# Patient Record
Sex: Female | Born: 1985 | Hispanic: Yes | Marital: Married | Smoking: Never smoker
Health system: Southern US, Community
[De-identification: ages and names within clinical notes are randomized; demographics above are authoritative.]

## PROBLEM LIST (undated history)

## (undated) ENCOUNTER — Inpatient Hospital Stay (HOSPITAL_COMMUNITY): Payer: Self-pay

## (undated) DIAGNOSIS — R51 Headache: Secondary | ICD-10-CM

## (undated) DIAGNOSIS — K5900 Constipation, unspecified: Secondary | ICD-10-CM

## (undated) DIAGNOSIS — D352 Benign neoplasm of pituitary gland: Secondary | ICD-10-CM

## (undated) DIAGNOSIS — M1990 Unspecified osteoarthritis, unspecified site: Secondary | ICD-10-CM

## (undated) DIAGNOSIS — O24419 Gestational diabetes mellitus in pregnancy, unspecified control: Secondary | ICD-10-CM

## (undated) HISTORY — DX: Gestational diabetes mellitus in pregnancy, unspecified control: O24.419

## (undated) HISTORY — DX: Benign neoplasm of pituitary gland: D35.2

## (undated) HISTORY — DX: Constipation, unspecified: K59.00

---

## 2006-03-31 ENCOUNTER — Inpatient Hospital Stay (HOSPITAL_COMMUNITY): Admission: AD | Admit: 2006-03-31 | Discharge: 2006-03-31 | Payer: Self-pay | Admitting: *Deleted

## 2006-03-31 ENCOUNTER — Ambulatory Visit: Payer: Self-pay | Admitting: *Deleted

## 2006-04-17 ENCOUNTER — Inpatient Hospital Stay (HOSPITAL_COMMUNITY): Admission: AD | Admit: 2006-04-17 | Discharge: 2006-04-21 | Payer: Self-pay | Admitting: Obstetrics and Gynecology

## 2006-04-18 ENCOUNTER — Ambulatory Visit: Payer: Self-pay | Admitting: Obstetrics and Gynecology

## 2006-04-18 ENCOUNTER — Encounter (INDEPENDENT_AMBULATORY_CARE_PROVIDER_SITE_OTHER): Payer: Self-pay | Admitting: Specialist

## 2009-02-18 ENCOUNTER — Inpatient Hospital Stay (HOSPITAL_COMMUNITY): Admission: AD | Admit: 2009-02-18 | Discharge: 2009-02-18 | Payer: Self-pay | Admitting: Obstetrics & Gynecology

## 2009-03-30 ENCOUNTER — Inpatient Hospital Stay (HOSPITAL_COMMUNITY): Admission: AD | Admit: 2009-03-30 | Discharge: 2009-03-30 | Payer: Self-pay | Admitting: Family Medicine

## 2009-03-30 ENCOUNTER — Ambulatory Visit: Payer: Self-pay | Admitting: Obstetrics and Gynecology

## 2009-05-15 ENCOUNTER — Ambulatory Visit (HOSPITAL_COMMUNITY): Admission: RE | Admit: 2009-05-15 | Discharge: 2009-05-15 | Payer: Self-pay | Admitting: Obstetrics & Gynecology

## 2009-07-10 ENCOUNTER — Ambulatory Visit (HOSPITAL_COMMUNITY): Admission: RE | Admit: 2009-07-10 | Discharge: 2009-07-10 | Payer: Self-pay | Admitting: Obstetrics & Gynecology

## 2009-09-21 ENCOUNTER — Inpatient Hospital Stay (HOSPITAL_COMMUNITY): Admission: AD | Admit: 2009-09-21 | Discharge: 2009-09-23 | Payer: Self-pay | Admitting: Obstetrics & Gynecology

## 2009-09-21 ENCOUNTER — Ambulatory Visit: Payer: Self-pay | Admitting: Advanced Practice Midwife

## 2010-03-18 LAB — CBC
HCT: 35.6 % — ABNORMAL LOW (ref 36.0–46.0)
MCH: 33.4 pg (ref 26.0–34.0)
MCHC: 35 g/dL (ref 30.0–36.0)
MCV: 95.2 fL (ref 78.0–100.0)
Platelets: 127 10*3/uL — ABNORMAL LOW (ref 150–400)

## 2010-03-24 LAB — WET PREP, GENITAL
Clue Cells Wet Prep HPF POC: NONE SEEN
Trich, Wet Prep: NONE SEEN

## 2010-03-24 LAB — HCG, QUANTITATIVE, PREGNANCY: hCG, Beta Chain, Quant, S: 87473 m[IU]/mL — ABNORMAL HIGH (ref <5)

## 2010-03-24 LAB — CBC
HCT: 36.5 % (ref 36.0–46.0)
Hemoglobin: 12.4 g/dL (ref 12.0–15.0)
MCHC: 34 g/dL (ref 30.0–36.0)
RBC: 3.98 MIL/uL (ref 3.87–5.11)
RDW: 12.6 % (ref 11.5–15.5)

## 2010-03-24 LAB — URINALYSIS, ROUTINE W REFLEX MICROSCOPIC
Bilirubin Urine: NEGATIVE
Glucose, UA: NEGATIVE mg/dL
Ketones, ur: NEGATIVE mg/dL
Nitrite: NEGATIVE
Protein, ur: NEGATIVE mg/dL
Urobilinogen, UA: 0.2 mg/dL (ref 0.0–1.0)

## 2010-03-24 LAB — POCT PREGNANCY, URINE: Preg Test, Ur: POSITIVE

## 2010-03-24 LAB — URINE MICROSCOPIC-ADD ON

## 2010-03-24 LAB — GC/CHLAMYDIA PROBE AMP, GENITAL: Chlamydia, DNA Probe: NEGATIVE

## 2010-03-28 LAB — WET PREP, GENITAL: Yeast Wet Prep HPF POC: NONE SEEN

## 2010-03-28 LAB — GC/CHLAMYDIA PROBE AMP, GENITAL: Chlamydia, DNA Probe: NEGATIVE

## 2010-05-21 NOTE — Op Note (Signed)
NAME:  Amber Harper, Amber Harper     ACCOUNT NO.:  [id number]   MEDICAL RECORD NO.:  [id number]          PATIENT TYPE:  INP   LOCATION:  9174                          FACILITY:  [facility]   PHYSICIAN:  Phil D. Okey Harper, M.D.     DATE OF BIRTH:  20-Jul-1985   DATE OF PROCEDURE:  04/18/2006  DATE OF DISCHARGE:                               OPERATIVE REPORT   PROCEDURE:  Low transverse cesarean section.   PREOPERATIVE DIAGNOSIS:  Cephalopelvic disproportion.   POSTOPERATIVE DIAGNOSIS:  Cephalopelvic disproportion.   SURGEON:  Dr. Okey Harper.   ASSISTANT:  Certified midwife, Deirdre Poe.   ANESTHESIA:  Epidural.   ESTIMATED BLOOD LOSS:  600 mL.   POSTOPERATIVE CONDITION:  Satisfactory.   PROCEDURE WAS AS FOLLOWS:  Under satisfactory epidural anesthesia with  the patient in dorsal supine position and Foley catheter in the urinary  bladder, the abdomen was prepped and draped in the usual sterile manner  and entered through a Pfannenstiel suprapubic incision, accentuating 2  cm above the symphysis pubis and extending for a total length of 16 cm.  The abdomen was entered by layers.  On entering the peritoneal cavity,  the self-retaining __________  retractor was placed into the wound, and  the visceral peritoneum on the anterior surface of the uterus was opened  transversely by sharp dissection, the bladder pushed away from the lower  segment which was entered by sharp and blunt dissection, and from a LOT  presentation, the vertex and the baby was easily removed.  The cord was  doubly clamped and divided.  The baby handed to pediatrician.  Samples  of blood taken from the cord for analysis.  The placenta was  spontaneously removed and the uterus explored.  The uterus was then  closed with a continuous running locked 0 Vicryl on an atraumatic  needle.  The fascia was closed with a continuous running 0 Vicryl on an  atraumatic needle.  Subcutaneous bleeders were controlled with hot  cautery.  Skin  edges approximated with skin staples.  A dry sterile  dressing was applied.  The patient transferred to the recovery room in  satisfactory condition.           ______________________________  Amber Harper, M.D.     PDR/MEDQ  D:  04/18/2006  T:  04/18/2006  Job:  [id number]

## 2010-05-21 NOTE — Discharge Summary (Signed)
NAME:  Amber Harper, Amber Harper     ACCOUNT NO.:  [id number]   MEDICAL RECORD NO.:  [id number]          PATIENT TYPE:  INP   LOCATION:  9104                          [facility]ILITY:  [facility]   PHYSICIAN:  Phil D. Okey Dupre, M.D.     DATE OF BIRTH:  06-Feb-1985   DATE OF ADMISSION:  04/17/2006  DATE OF DISCHARGE:  04/21/2006                               DISCHARGE SUMMARY   Patient was admitted at 40 weeks and 1 day gestation with spontaneous  rupture of membranes and early labor.  Patient's labor was augmented  with Pitocin.  Patient continued to labor for approximately 20 hours  before the decision was made for a low transverse C-section.  Preoperative diagnosis was deep transverse arrest with reassuring fetal  heart tones.   On April 18, 2006, a low transverse C-section was performed by Dr. Okey Dupre  and Caren Griffins, C.N.M.  Please see operative note for details of this  surgery.  A female infant with Apgars of 8 and 9 was delivered and patient  proceeded to have admission to the post-partum unit, where she had three  days in-house observation and uneventful course of stay.   Patient was discharged on hospital day #3.  Vital signs remained stable,  and patient was afebrile throughout her hospital course.  Was discharged  home with her female infant.  She was given instructions to follow up at  the health department for post partum visit in six weeks in order for  the [facility] nurse to remove staples on postop day #5 during her home  visit.  Patient was sent home for prescription for Percocet 1-2 tablets  q.4-6h. p.r.n. pain, ibuprofen 600 mg p.o. q.6h. p.r.n. cramping, and  prenatal vitamins 1 p.o. daily while breast-feeding.  Patient was in  stable status at discharge.      Maylon Cos, C.N.M.      Phil D. Okey Dupre, M.D.  Electronically Signed    SS/MEDQ  D:  07/11/2006  T:  07/12/2006  Job:  [id number]

## 2012-07-04 ENCOUNTER — Ambulatory Visit: Payer: No Typology Code available for payment source | Attending: Family Medicine | Admitting: Family Medicine

## 2012-07-04 VITALS — BP 104/71 | HR 64 | Temp 98.0°F | Resp 16 | Ht <= 58 in | Wt 126.0 lb

## 2012-07-04 DIAGNOSIS — K649 Unspecified hemorrhoids: Secondary | ICD-10-CM | POA: Insufficient documentation

## 2012-07-04 DIAGNOSIS — R635 Abnormal weight gain: Secondary | ICD-10-CM

## 2012-07-04 DIAGNOSIS — J309 Allergic rhinitis, unspecified: Secondary | ICD-10-CM

## 2012-07-04 DIAGNOSIS — K625 Hemorrhage of anus and rectum: Secondary | ICD-10-CM

## 2012-07-04 DIAGNOSIS — K5900 Constipation, unspecified: Secondary | ICD-10-CM | POA: Insufficient documentation

## 2012-07-04 HISTORY — DX: Constipation, unspecified: K59.00

## 2012-07-04 LAB — CBC
HCT: 38.4 % (ref 36.0–46.0)
MCV: 84.4 fL (ref 78.0–100.0)
RDW: 13.7 % (ref 11.5–15.5)
WBC: 6.9 10*3/uL (ref 4.0–10.5)

## 2012-07-04 LAB — TSH: TSH: 1.235 u[IU]/mL (ref 0.350–4.500)

## 2012-07-04 LAB — LIPID PANEL: Total CHOL/HDL Ratio: 3.8 Ratio

## 2012-07-04 LAB — POCT URINE PREGNANCY: Preg Test, Ur: NEGATIVE

## 2012-07-04 MED ORDER — LORATADINE 10 MG PO TABS: 10.0000 mg | ORAL_TABLET | Freq: Every day | ORAL | Status: DC

## 2012-07-04 MED ORDER — DOCUSATE SODIUM 100 MG PO CAPS: 100.0000 mg | ORAL_CAPSULE | Freq: Two times a day (BID) | ORAL | Status: DC

## 2012-07-04 MED ORDER — FLUTICASONE PROPIONATE 50 MCG/ACT NA SUSP: 2.0000 | Freq: Every day | NASAL | Status: DC

## 2012-07-04 NOTE — Progress Notes (Signed)
Patient ID: Amber Harper, female   DOB: 10-30-1985, 27 y.o.   MRN: [id number] Spanish Translator used to speak with patient   CC: establish  HPI: Pt reports that she has been having constipation.  Pt says that she is having some bleeding from hemorrhoids. Pt says that she is taking vitamins but not taking any birth control pills.  Pt was concerned about weight gain.  Pt says she is using condoms with her husband for birth control.   No Known Allergies History reviewed. No pertinent past medical history. No current outpatient prescriptions on file prior to visit.   No current facility-administered medications on file prior to visit.   History reviewed. No pertinent family history. History   Social History  . Marital Status: Single    Spouse Name: N/A    Number of Children: N/A  . Years of Education: N/A   Occupational History  . Not on file.   Social History Main Topics  . Smoking status: Not on file  . Smokeless tobacco: Not on file  . Alcohol Use: Not on file  . Drug Use: Not on file  . Sexually Active: Not on file   Other Topics Concern  . Not on file   Social History Narrative  . No narrative on file    Review of Systems  Constitutional: Negative for fever, chills, diaphoresis, activity change, appetite change and fatigue.  HENT: Negative for ear pain, nosebleeds, congestion, facial swelling, rhinorrhea, neck pain, neck stiffness and ear discharge.   Eyes: Negative for pain, discharge, redness, itching and visual disturbance.  Respiratory: Negative for cough, choking, chest tightness, shortness of breath, wheezing and stridor.   Cardiovascular: Negative for chest pain, palpitations and leg swelling.  Gastrointestinal: Negative for abdominal distention.  Genitourinary: Negative for dysuria, urgency, frequency, hematuria, flank pain, decreased urine volume, difficulty urinating and dyspareunia.  Musculoskeletal: Negative for back pain, joint swelling,  arthralgias and gait problem.  Neurological: Negative for dizziness, tremors, seizures, syncope, facial asymmetry, speech difficulty, weakness, light-headedness, numbness and headaches.  Hematological: Negative for adenopathy. Does not bruise/bleed easily.  Psychiatric/Behavioral: Negative for hallucinations, behavioral problems, confusion, dysphoric mood, decreased concentration and agitation.    Objective:   Filed Vitals:   07/04/12 1308  BP: 104/71  Pulse: 64  Temp: 98 F (36.7 C)  Resp: 16    Physical Exam  Constitutional: Appears well-developed and well-nourished. No distress.  HENT: Normocephalic. External right and left ear normal. Oropharynx is clear and moist. large lesion on tongue Eyes: Conjunctivae and EOM are normal. PERRLA, no scleral icterus.  Neck: Normal ROM. Neck supple. No JVD. No tracheal deviation. No thyromegaly.  CVS: RRR, S1/S2 +, no murmurs, no gallops, no carotid bruit.  Pulmonary: Effort and breath sounds normal, no stridor, rhonchi, wheezes, rales.  Abdominal: Soft. BS +,  no distension, tenderness, rebound or guarding.  Musculoskeletal: Normal range of motion. No edema and no tenderness.  Lymphadenopathy: No lymphadenopathy noted, cervical, inguinal. Neuro: Alert. Normal reflexes, muscle tone coordination. No cranial nerve deficit. Skin: Skin is warm and dry. No rash noted. Not diaphoretic. No erythema. No pallor.  Psychiatric: Normal mood and affect. Behavior, judgment, thought content normal.   Lab Results  Component Value Date   WBC 12.1* 09/21/2009   HGB 12.5 09/21/2009   HCT 35.6* 09/21/2009   MCV 95.2 09/21/2009   PLT 127* 09/21/2009   No results found for this basename: CREATININE, BUN, NA, K, CL, CO2    No results found for this  basename: HGBA1C   Lipid Panel  No results found for this basename: chol, trig, hdl, cholhdl, vldl, ldlcalc       Assessment and plan:   Patient Active Problem List   Diagnosis Date Noted  . Unspecified  constipation 07/04/2012   Allergic Rhinitis Chronic Constipation Large lesion on left side of tongue Amenorrhea  Check pregnancy test today - negative   Start medications for constipation - colace 100 mg po BID   Follow lab results  RTC in 3 months  The patient was given clear instructions to go to ER or return to medical center if symptoms don't improve, worsen or new problems develop.  The patient verbalized understanding.  The patient was told to call to get any lab results if not heard anything in the next week.    Rodney Langton, MD, CDE, FAAFP [facility] [place], [place]

## 2012-07-04 NOTE — Progress Notes (Signed)
Patient here to establish care Needs physical 

## 2012-07-04 NOTE — Patient Instructions (Signed)
Constipacin - Adulto  (Constipation, Adult)  Constipacin significa que una persona tiene menos de 3 evacuaciones en una semana, hay dificultad para evacuar el intestino, o las heces son secas, duras, o ms grandes que lo normal. A medida que envejecemos el estreimiento es ms comn. Si intenta curar el estreimiento con medicamentos que producen la evacuacin intestinal (laxantes), el problema puede empeorar. El uso prolongado de laxantes puede hacer que los msculos del colon se debiliten. Una dieta baja en fibra, no tomar suficientes lquidos y el uso de ciertos medicamentos pueden Agricultural engineer.  CAUSAS   Ciertos medicamentos, como los antidepresivos, analgsicos, suplementos de hierro, anticidos y diurticos.   Algunas enfermedades, como la diabetes, el sndrome del colon irritable (SII), enfermedad de la tiroides, o depresin.   No beber suficiente agua.   No consumir suficientes alimentos ricos en fibra.   Situaciones de estrs o viajes.  Falta de actividad fsica o de ejercicio.  No ir al bao cuando siente la necesidad.  Ignorar la necesidad sbita de Film/video editor intestino.  Uso en exceso de laxantes. SNTOMAS   Evacuar el intestino menos de 3 veces a la semana.   Dificultad para mover el [place] y duras, o ms grandes que las normales.   Sensacin de estar lleno o distendido.   Dolor en la parte baja del abdomen  No se siente alivio despus de evacuar el intestino. DIAGNSTICO  El mdico le har una historia clnica y le har un examen fsico. Pueden hacerle exmenes adicionales para el estreimiento grave. Algunas pruebas son:   Un radiografa con enema de bario para examinar el recto, el colon y en algunos casos el intestino delgado.  Una sigmoidoscopia para examinar el colon inferior.  Una colonoscopia para examinar todo el colon. TRATAMIENTO  El tratamiento depender de la gravedad de la constipacin y de la  causa. Algunos tratamientos dietticos son beber ms lquidos y comer ms alimentos ricos en fibra. El cambio en el estilo de vida incluye hacer ejercicios de [place] regular. Si estas recomendaciones para Animator dieta y en el estilo de vida no ayudan, el mdico le puede indicar el uso de laxantes de venta libre para Industrial/product designer movimiento intestinal. Los medicamentos con Statistician se pueden prescribir si los medicamentos de venta libre no lo mejoran.  INSTRUCCIONES PARA EL CUIDADO EN EL HOGAR   Aumente el consumo de alimentos con [place], como frutas, verduras, granos enteros y frijoles. Limite los azcares ricos en grasas y procesados   en su dieta, tales como papas fritas, hamburguesas, galletas, dulces y refrescos.   Puede agregar un suplemento de fibra a su dieta si no obtiene lo suficiente de los alimentos.   Debe ingerir gran cantidad de lquido para mantener la orina de tono claro o color amarillo plido.   Haga ejercicios regularmente o segn las indicaciones de su mdico.   Vaya al bao cuando sienta la necesidad de ir. No espere.  Tome slo la medicacin que le indic el profesional.  No tome otros medicamentos para la constipacin sin Teacher, adult education a su mdico. SOLICITE ATENCIN MDICA DE INMEDIATO SI:   Observa sangre brillante en las heces.   La constipacin dura ms de 4 das o empeora.   Siente dolor abdominal o rectal.   Las heces son delgadas como un lpiz.  Pierde peso de [place] inexplicable. ASEGRESE DE QUE:   Comprende estas instrucciones.  Controlar su enfermedad.  Solicitar ayuda de inmediato si  no mejora o empeora. Document Released: 01/09/2007 Document Revised: 06/21/2011 [facility] Patient Information 2014 [place], [place]. Ejercicios para perder peso (Exercise to Lose Weight) La actividad fsica y Neomia Dear dieta saludable ayudan a perder peso. El mdico podr sugerirle ejercicios especficos. IDEAS Y CONSEJOS PARA HACER EJERCICIOS  Elija  opciones econmicas que disfrute hacer , como caminar, andar en bicicleta o los vdeos para ejercitarse.  Utilice las Microbiologist del ascensor.  Camine durante la hora del almuerzo.  Estacione el auto lejos del lugar de [place] o [place].  Concurra a un gimnasio o tome clases de gimnasia.  Comience con 5  10 minutos de actividad fsica por da. Ejercite hasta 30 minutos, 4 a 6 das por [address]  Utilice zapatos que tengan un buen soporte y ropas cmodas.  Elongue antes y despus de Company secretary.  Ejercite hasta que aumente la respiracin y el corazn palpite rpido.  Beba agua extra cuando ejercite.  No haga ejercicio Firefighter, sentirse mareado o que le falte mucho el aire. La actividad fsica puede quemar alrededor de 150 caloras.  Correr 20 cuadras en 15 minutos.  Jugar vley durante 45 a 60 minutos.  Limpiar y encerar el auto durante 45 a 60 minutos.  Jugar ftbol americano de toque.  Caminar 25 cuadras en 35 minutos.  Empujar un cochecito 20 cuadras en 30 minutos.  Jugar baloncesto durante 30 minutos.  Rastrillar hojas secas durante 30 minutos.  Andar en bicicleta 80 cuadras en 30 minutos.  Caminar 30 cuadras en 30 minutos.  Bailar durante 30 minutos.  Quitar la nieve con una pala durante 15 minutos.  Nadar vigorosamente durante 20 minutos.  Subir escaleras durante 15 minutos.  Andar en bicicleta 60 cuadras durante 15 minutos.  Arreglar el jardn entre 30 y 45 minutos.  Saltar a la soga durante 15 minutos.  Limpiar vidrios o pisos durante 45 a 60 minutos. Document Released: 03/26/2010 Document Revised: 03/14/2011 [facility] Patient Information 2014 [place], [place]. Dieta basada en el recuento de caloras (Calorie Counting Diet) Una dieta basada en el recuento de caloras requiere que consuma la cantidad de caloras que usted necesita durante Medical laboratory scientific officer. Las caloras son la medida de la energa que se obtienen de los alimentos que consumimos.  Ingerir la cantidad [place] de caloras es importante para Pharmacologist un peso saludable. Si ingiere demasiadas caloras, el organismo las [employer] y aumentar de [place]. Si ingiere pocas caloras, perder peso. El recuento de las caloras que se ingieren durante Designer, fashion/clothing si recibe la cantidad Geologist, engineering. Un nutricionista matriculado podr determinar cuntas caloras necesita por da. La cantidad de caloras necesarias vara de Neomia Dear persona a otra. Si su objetivo es perder peso deber consumir menos caloras. Si tiene sobrepeso o tiene problemas de [employer] cardacas, hipertensin arterial o diabetes, la prdida de algunos kilos lo beneficiar. Si su objetivo es aumentar de peso deber consumir ms caloras. Si tiene problemas de salud que requieran que usted tenga ms [place], puede ser necesario que aumente de [place]. CONSEJOS Ya sea que aumente o disminuya el nmero de caloras que consume [employer], puede ser difcil acostumbrarse a Multimedia programmer los hbitos de lo que come o bebe. Los siguientes consejos lo ayudarn a Midwife un control del nmero de caloras que consume.  La medicin de los [employer] hogar, con una jarra medidora lo ayudar a saber la cantidad real de alimentos y caloras que consume.  En los restaurantes se sirven alimentos en diferentes porciones  y tamaos. Es frecuente que en los restaurantes se sirvan los alimentos en cantidades que puedan dividirse en 2 porciones o ms. Al salir a Psychologist, forensic, puede ser til estimar cuntas porciones le sirven. Por ejemplo, una porcin a arroz cocido es de  taza y tiene el tamao de la mitad de un puo. Conocer el tamao de las porciones le ayudar a tener una mejor idea de la cantidad de comida que come en un restaurante.  Pida porciones pequeas o solicite las porciones para nios.  Planifique comer la mitad de una porcin en el restaurante y lleve el resto a su hogar o comprtala con un  amigo.  Lea las etiquetas para conocer el contenido de caloras y el tamao de las porciones. La mayora de los alimentos envasados tiene la informacin nutricional en algn lugar del envase. Aqu podr encontrar cuntas porciones contiene el envase, el tamao de la porcin y el nmero de caloras de [place].  Por ejemplo, digamos que un envase contiene tres galletitas. En la Informacin Nutricional se [place] que una porcin corresponde a Financial controller. [place], dice que hay tres porciones en el envase. En la seccin de las caloras se informa que contiene 90 caloras. Esto significa que cada galletita tiene 90 caloras. Si come una galletita, habr consumido 90 caloras. Si come tres galletitas, habr consumido tres veces esa cantidad, o sea 270 caloras. La lista que sigue le mostrar el tamao de algunas porciones comunes.   1 onza.................4 dados apilados  3 onzas..............Marland KitchenUn mazo de cartas  1 cucharadita...Marland KitchenMarland KitchenLa punta de un dedo pequeo  1 cucharada.......Marland KitchenUn dedo  2 cucharadas....Marland KitchenMarland KitchenUna pelota de golf   taza..................la mitad de un puo  1 taza..................Marland KitchenUn puo LLEVE UN REGISTRO DE LO QUE COME Escriba todo lo que come, las cantidades y Medical illustrator de Advertising copywriter en cada comida que haga Administrator. Al final o durante el da puede ir sumando la cantidad de caloras que ha ingerido. Puede ser de utilidad crear Neomia Dear lista como la que se [place] a continuacin. Conozca la informacin sobre caloras [employer]. Desayuno  Salvado (1 taza, 110 caloras).  Leche descremada ( taza, 45 caloras). Colacin  Manzana (1 mediana, 80 caloras). Almuerzo  [place] (1 taza, 20 caloras).  Tomate ( mediana, 20 caloras).  Pollo, pechuga (3 onzas, 165 caloras).  Queso shredded cheddar ( taza, 110 calories).  Aderezo italiano diettico (2 cucharadas, 60 caloras).  Pan de trigo integral (1 rebanada, 80 caloras).  Margarina (1 cucharada, 35  caloras).  Sopa de vegetales (1 taza, 160 caloras). Cena  [place] de cerdo (3 onzas, 190 caloras).  Arroz marrn (1 taza, 215 caloras).  Brcoli hervido ( cup, 20 calories).  Jinny Sanders (1  taza, 65 caloras).  Crema batida (1 cucharada, 50 caloras). Total de caloras diarias: 1425 Document Released: 04/07/2008 Document Revised: 03/14/2011 [facility] Patient Information 2014 [place], [place].

## 2012-07-05 LAB — COMPLETE METABOLIC PANEL WITH GFR
AST: 18 U/L (ref 0–37)
Alkaline Phosphatase: 74 U/L (ref 39–117)
Creat: 0.69 mg/dL (ref 0.50–1.10)
GFR, Est African American: >89 mL/min
Sodium: 140 mEq/L (ref 135–145)

## 2012-07-05 NOTE — Progress Notes (Signed)
Quick Note:  Please provide language translator as needed: Please inform patient that her labs came back okay.   Rodney Langton, MD, CDE, FAAFP [facility] [place], [place]   ______

## 2012-07-10 ENCOUNTER — Ambulatory Visit: Payer: No Typology Code available for payment source | Attending: Family Medicine | Admitting: Family Medicine

## 2012-07-10 VITALS — BP 93/71 | HR 60 | Temp 98.7°F | Resp 16 | Ht <= 58 in | Wt 127.0 lb

## 2012-07-10 DIAGNOSIS — R220 Localized swelling, mass and lump, head: Secondary | ICD-10-CM

## 2012-07-10 DIAGNOSIS — I861 Scrotal varices: Secondary | ICD-10-CM | POA: Insufficient documentation

## 2012-07-10 NOTE — Patient Instructions (Signed)
Tongue Laceration   A tongue laceration is a cut on the tongue. Over the next 1 to 2 days, you will see that the wound edges appear gray in color. The edges may appear ragged and slightly spread apart. Because of all the normal bacteria in the mouth, these wounds are contaminated, but this is not an infection that needs antibiotics. Most wounds heal with no problems despite their appearance.  TREATMENT   Most tongue lacerations only go partway through the tongue. This type of injury generally does not need stitches (sutures). More serious injuries penetrate the tongue deeper and may need sutures and follow-up care.  HOME CARE INSTRUCTIONS    Cover an ice cube in a thin cloth and hold it directly on the cut for 1 to 3 minutes at a time, 6 to 10 times per day. Do this for 1 day. This can help reduce pain and swelling.   After the first day, rinse the mouth with a warm, saltwater wash 4 to 6 times per day, or as your caregiver instructs.   If there are no injuries to your teeth, continue oral hygiene and gentle brushing. Do not brush loose or broken teeth or teeth that have been put back into normal position by your caregiver.   Large or complex cuts may require antibiotics to prevent infection. Take your antibiotics as directed. Finish them even if you start to feel better.   Do not eat or drink hot food or beverages while your mouth is still numb.   Do not eat hard foods (such as apples) or chewy foods (such as broiled meat) until your caregiver advises you otherwise.   If your caregiver used sutures to repair the cut, do not pull or chew them. If you do this, they will gradually loosen and may become untied.   Only take over-the-counter or prescription medicines for pain, discomfort, or fever as directed by your caregiver.  You may need a tetanus shot if:   You cannot remember when you had your last tetanus shot.   You have never had a tetanus shot.  If you get a tetanus shot, your arm may swell, get red,  and feel warm to the touch. This is common and not a problem. If you need a tetanus shot and you choose not to have one, there is a rare chance of getting tetanus. Sickness from tetanus can be serious.  SEEK IMMEDIATE MEDICAL CARE IF:    You develop swelling or increasing pain in the wound or in other parts of your mouth or face.   You see pus coming from the wound. Some drainage in the mouth is normal.   You have a fever.   You notice the edges of the wound break open after sutures have been removed.   You develop bleeding that does not stop when you apply pressure.   You develop any breathing problems.  MAKE SURE YOU:   Understand these instructions.   Will watch your condition.   Will get help right away if you are not doing well or get worse.  Document Released: 12/20/2005 Document Revised: 03/14/2011 Document Reviewed: 06/24/2010  [employer] Patient Information 2014 [employer].

## 2012-07-10 NOTE — Progress Notes (Signed)
Patient ID: Amber Harper, female   DOB: Nov 20, 1985, 27 y.o.   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 The patient reports that he does hear take her at times.  She does not report bleeding from the tumor.  The patient reports that she is willing to go [facility] care.   No Known Allergies History reviewed. No pertinent past medical history. Current Outpatient Prescriptions on File Prior to Visit  Medication Sig Dispense Refill  . docusate sodium (COLACE) 100 MG capsule Take 1 capsule (100 mg total) by mouth 2 (two) times daily.  60 capsule  2  . fluticasone (FLONASE) 50 MCG/ACT nasal spray Place 2 sprays into the nose daily.  16 g  6  . loratadine (CLARITIN) 10 MG tablet Take 1 tablet (10 mg total) by mouth daily.  30 tablet  2  . Multiple Vitamins-Minerals (MULTIVITAMIN PO) Take by mouth.       No current facility-administered medications on file prior to visit.   Family History  Problem Relation Age of Onset  . Diabetes Mother   . Diabetes Father    History   Social History  . Marital Status: Single    Spouse Name: Lars Mage    Number of Children: 2  . Years of Education: 6    Occupational History  . Housewife    Social History Main Topics  . Smoking status: Never Smoker   . Smokeless tobacco: Not on file  . Alcohol Use: No  . Drug Use: No  . Sexually Active: Not on file   Other Topics Concern  . Not on file   Social History Narrative  . No narrative on file    Review of Systems  Constitutional: Negative for fever, chills, diaphoresis, activity change, appetite change and fatigue.  HENT: Negative for ear pain, nosebleeds, congestion, facial swelling, rhinorrhea, neck pain, neck stiffness and ear discharge.   Eyes: Negative for pain, discharge,  redness, itching and visual disturbance.  Respiratory: Negative for cough, choking, chest tightness, shortness of breath, wheezing and stridor.   Cardiovascular: Negative for chest pain, palpitations and leg swelling.  Gastrointestinal: Negative for abdominal distention.  Genitourinary: Negative for dysuria, urgency, frequency, hematuria, flank pain, decreased urine volume, difficulty urinating and dyspareunia.  Musculoskeletal: Negative for back pain, joint swelling, arthralgias and gait problem.  Neurological: Negative for dizziness, tremors, seizures, syncope, facial asymmetry, speech difficulty, weakness, light-headedness, numbness and headaches.  Hematological: Negative for adenopathy. Does not bruise/bleed easily.  Psychiatric/Behavioral: Negative for hallucinations, behavioral problems, confusion, dysphoric mood, decreased concentration and agitation.    Objective:   Filed Vitals:   07/10/12 1035  BP: 93/71  Pulse:   Temp:   Resp:     Physical Exam  Constitutional: Appears well-developed and well-nourished. No distress.  HENT: Normocephalic. External right and left ear normal. Oropharynx is clear and moist. large varicocoele tumor on tip of tongue.  Eyes: Conjunctivae and EOM are normal. PERRLA, no scleral icterus.  Neck: Normal ROM. Neck supple. No JVD. No tracheal deviation. No thyromegaly.  CVS: RRR, S1/S2 +, no murmurs, no gallops, no carotid bruit.  Pulmonary: Effort and breath sounds normal, no stridor, rhonchi, wheezes, rales.  Abdominal: Soft. BS +,  no distension, tenderness, rebound or guarding.  Musculoskeletal: Normal range of  motion. No edema and no tenderness.  Lymphadenopathy: No lymphadenopathy noted, cervical, inguinal. Neuro: Alert. Normal reflexes, muscle tone coordination. No cranial nerve deficit. Skin: Skin is warm and dry. No rash noted. Not diaphoretic. No erythema. No pallor.  Psychiatric: Normal mood and affect. Behavior, judgment, thought content  normal.   Lab Results  Component Value Date   WBC 6.9 07/04/2012   HGB 13.5 07/04/2012   HCT 38.4 07/04/2012   MCV 84.4 07/04/2012   PLT 212 07/04/2012   Lab Results  Component Value Date   CREATININE 0.69 07/04/2012   BUN 10 07/04/2012   NA 140 07/04/2012   K 4.2 07/04/2012   CL 103 07/04/2012   CO2 27 07/04/2012    No results found for this basename: HGBA1C   Lipid Panel     Component Value Date/Time   CHOL 160 07/04/2012 1335   TRIG 276* 07/04/2012 1335   HDL 42 07/04/2012 1335   CHOLHDL 3.8 07/04/2012 1335   VLDL 55* 07/04/2012 1335   LDLCALC 63 07/04/2012 1335       Assessment and plan:   Patient Active Problem List   Diagnosis Date Noted  . Varicocele 07/10/2012  . 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 Unspecified constipation 07/04/2012   Referral to ENT for tongue mass , varicocele - Pt is willing to go to [facility] for care  The patient was given clear instructions to go to ER or return to medical center if symptoms don't improve, worsen or new problems develop.  The patient verbalized understanding.  The patient was told to call to get any lab results if not heard anything in the next week.    RTC in 6 mos  The patient was given clear instructions to go to ER or return to medical center if symptoms don't improve, worsen or new problems develop.  The patient verbalized understanding.  The patient was told to call to get any lab results if not heard anything in the next week.    Rodney Langton, MD, CDE, FAAFP [facility] [place], [place]

## 2012-07-10 NOTE — Progress Notes (Signed)
Patient complains of spot on tongue that she can't remember how long its been there; states tongue swelling but denies problems with breathing.

## 2012-07-12 ENCOUNTER — Telehealth: Payer: Self-pay

## 2012-07-12 NOTE — Telephone Encounter (Signed)
Can you call patient and give her the results Thank you

## 2012-07-12 NOTE — Telephone Encounter (Signed)
Message copied by Lestine Mount on Thu Jul 12, 2012 11:37 AM ------      Message from: Cleora Fleet      Created: Thu Jul 05, 2012  8:51 AM       Please provide language translator as needed: Please inform patient that her labs came back okay.                  Rodney Langton, MD, CDE, FAAFP      [facility]      [facility]      [place], [place]        ------

## 2012-07-17 NOTE — Telephone Encounter (Signed)
Pt aware of her results 

## 2012-09-04 ENCOUNTER — Other Ambulatory Visit (HOSPITAL_COMMUNITY): Payer: Self-pay | Admitting: Otolaryngology

## 2012-09-12 ENCOUNTER — Encounter (HOSPITAL_COMMUNITY): Payer: Self-pay

## 2012-09-12 ENCOUNTER — Encounter (HOSPITAL_COMMUNITY)
Admission: RE | Admit: 2012-09-12 | Discharge: 2012-09-12 | Disposition: A | Discharge status: Home / Self Care | Payer: No Typology Code available for payment source | Source: Ambulatory Visit | Attending: Otolaryngology | Admitting: Otolaryngology

## 2012-09-12 DIAGNOSIS — Z01818 Encounter for other preprocedural examination: Secondary | ICD-10-CM | POA: Insufficient documentation

## 2012-09-12 DIAGNOSIS — Z01812 Encounter for preprocedural laboratory examination: Secondary | ICD-10-CM | POA: Insufficient documentation

## 2012-09-12 DIAGNOSIS — Z01810 Encounter for preprocedural cardiovascular examination: Secondary | ICD-10-CM | POA: Insufficient documentation

## 2012-09-12 HISTORY — DX: Unspecified osteoarthritis, unspecified site: M19.90

## 2012-09-12 HISTORY — DX: Headache: R51

## 2012-09-12 LAB — BASIC METABOLIC PANEL
BUN: 12 mg/dL (ref 6–23)
Creatinine, Ser: 0.63 mg/dL (ref 0.50–1.10)
GFR calc Af Amer: >90 mL/min (ref >90)
GFR calc non Af Amer: >90 mL/min (ref >90)

## 2012-09-12 LAB — HCG, SERUM, QUALITATIVE: Preg, Serum: NEGATIVE

## 2012-09-12 LAB — CBC
HCT: 39 % (ref 36.0–46.0)
MCHC: 36.7 g/dL — ABNORMAL HIGH (ref 30.0–36.0)
RDW: 12.2 % (ref 11.5–15.5)

## 2012-09-12 NOTE — Pre-Procedure Instructions (Signed)
Amber Harper  09/12/2012   Your procedure is scheduled on:  Friday September 14, 2012  Report to [facility] at 0530 AM.  Call this number if you have problems the morning of surgery: [phone]   Remember:   Do not eat food or drink liquids after midnight.   Take these medicines the morning of surgery with A SIP OF WATER: none   Do not wear jewelry, make-up or nail polish.  Do not wear lotions, powders, or perfumes. You may wear deodorant.  Do not shave 48 hours prior to surgery.  Do not bring valuables to the hospital.  [facility] is not responsible for any belongings or valuables.  Contacts, dentures or bridgework may not be worn into surgery.  Leave suitcase in the car. After surgery it may be brought to your room.  For patients admitted to the hospital, checkout time is 11:00 AM the day of discharge.   Patients discharged the day of surgery will not be allowed to drive home.  Name and phone number of your driver: Spouse  Special Instructions: Shower using CHG 2 nights before surgery and the night before surgery.  If you shower the day of surgery use CHG.  Use special wash - you have one bottle of CHG for all showers.  You should use approximately 1/3 of the bottle for each shower.   Please read over the following fact sheets that you were given: Pain Booklet, Coughing and Deep Breathing and Surgical Site Infection Prevention

## 2012-09-14 ENCOUNTER — Encounter (HOSPITAL_COMMUNITY)
Admission: RE | Disposition: A | Discharge status: Home / Self Care | Payer: Self-pay | Source: Ambulatory Visit | Attending: Otolaryngology

## 2012-09-14 ENCOUNTER — Encounter (HOSPITAL_COMMUNITY): Payer: Self-pay | Admitting: *Deleted

## 2012-09-14 ENCOUNTER — Ambulatory Visit (HOSPITAL_COMMUNITY)
Admission: RE | Admit: 2012-09-14 | Discharge: 2012-09-14 | Disposition: A | Discharge status: Home / Self Care | Payer: No Typology Code available for payment source | Source: Ambulatory Visit | Attending: Otolaryngology | Admitting: Otolaryngology

## 2012-09-14 ENCOUNTER — Encounter (HOSPITAL_COMMUNITY): Payer: Self-pay | Admitting: Anesthesiology

## 2012-09-14 ENCOUNTER — Ambulatory Visit (HOSPITAL_COMMUNITY): Payer: No Typology Code available for payment source | Admitting: Anesthesiology

## 2012-09-14 DIAGNOSIS — K148 Other diseases of tongue: Secondary | ICD-10-CM | POA: Insufficient documentation

## 2012-09-14 DIAGNOSIS — Z79899 Other long term (current) drug therapy: Secondary | ICD-10-CM | POA: Insufficient documentation

## 2012-09-14 DIAGNOSIS — M129 Arthropathy, unspecified: Secondary | ICD-10-CM | POA: Insufficient documentation

## 2012-09-14 HISTORY — PX: MASS EXCISION: SHX2000

## 2012-09-14 SURGERY — EXCISION MASS
Anesthesia: General | Site: Mouth | Laterality: Left | Wound class: Clean Contaminated

## 2012-09-14 MED ORDER — MIDAZOLAM HCL 5 MG/5ML IJ SOLN
INTRAMUSCULAR | Status: DC | PRN
  Administered 2012-09-14: 2 mg via INTRAVENOUS

## 2012-09-14 MED ORDER — ONDANSETRON HCL 4 MG/2ML IJ SOLN: 4.0000 mg | Freq: Four times a day (QID) | INTRAMUSCULAR | Status: DC | PRN

## 2012-09-14 MED ORDER — DEXAMETHASONE SODIUM PHOSPHATE 10 MG/ML IJ SOLN
10.0000 mg | Freq: Once | INTRAMUSCULAR | Status: AC
  Administered 2012-09-14: 10 mg via INTRAVENOUS
  Filled 2012-09-14 (×2): qty 1

## 2012-09-14 MED ORDER — OXYMETAZOLINE HCL 0.05 % NA SOLN
NASAL | Status: AC
  Filled 2012-09-14: qty 15

## 2012-09-14 MED ORDER — FENTANYL CITRATE 0.05 MG/ML IJ SOLN
INTRAMUSCULAR | Status: DC | PRN
  Administered 2012-09-14: 75 ug via INTRAVENOUS

## 2012-09-14 MED ORDER — LACTATED RINGERS IV SOLN
INTRAVENOUS | Status: DC | PRN
  Administered 2012-09-14: 07:00:00 via INTRAVENOUS

## 2012-09-14 MED ORDER — LIDOCAINE HCL (CARDIAC) 20 MG/ML IV SOLN
INTRAVENOUS | Status: DC | PRN
  Administered 2012-09-14: 100 mg via INTRAVENOUS

## 2012-09-14 MED ORDER — OXYCODONE HCL 5 MG PO TABS: 5.0000 mg | ORAL_TABLET | Freq: Once | ORAL | Status: DC | PRN

## 2012-09-14 MED ORDER — OXYCODONE HCL 5 MG/5ML PO SOLN: 5.0000 mg | Freq: Once | ORAL | Status: DC | PRN

## 2012-09-14 MED ORDER — 0.9 % SODIUM CHLORIDE (POUR BTL) OPTIME
TOPICAL | Status: DC | PRN
  Administered 2012-09-14: 1000 mL

## 2012-09-14 MED ORDER — SUCCINYLCHOLINE CHLORIDE 20 MG/ML IJ SOLN
INTRAMUSCULAR | Status: DC | PRN
  Administered 2012-09-14: 100 mg via INTRAVENOUS

## 2012-09-14 MED ORDER — ARTIFICIAL TEARS OP OINT
TOPICAL_OINTMENT | OPHTHALMIC | Status: DC | PRN
  Administered 2012-09-14: 1 via OPHTHALMIC

## 2012-09-14 MED ORDER — CLINDAMYCIN PHOSPHATE 600 MG/50ML IV SOLN
600.0000 mg | Freq: Once | INTRAVENOUS | Status: AC
  Administered 2012-09-14: 600 mg via INTRAVENOUS
  Filled 2012-09-14: qty 50

## 2012-09-14 MED ORDER — LIDOCAINE-EPINEPHRINE 1 %-1:100000 IJ SOLN
INTRAMUSCULAR | Status: DC | PRN
  Administered 2012-09-14: 20 mL

## 2012-09-14 MED ORDER — FENTANYL CITRATE 0.05 MG/ML IJ SOLN: 25.0000 ug | INTRAMUSCULAR | Status: DC | PRN

## 2012-09-14 MED ORDER — PROPOFOL 10 MG/ML IV BOLUS
INTRAVENOUS | Status: DC | PRN
  Administered 2012-09-14: 200 mg via INTRAVENOUS

## 2012-09-14 MED ORDER — LIDOCAINE-EPINEPHRINE 1 %-1:100000 IJ SOLN
INTRAMUSCULAR | Status: AC
  Filled 2012-09-14: qty 1

## 2012-09-14 SURGICAL SUPPLY — 31 items
CANISTER SUCTION 2500CC (MISCELLANEOUS) ×2 IMPLANT
CATH ROBINSON RED A/P 10FR (CATHETERS) IMPLANT
CLEANER TIP ELECTROSURG 2X2 (MISCELLANEOUS) ×4 IMPLANT
CLOTH BEACON ORANGE TIMEOUT ST (SAFETY) ×2 IMPLANT
COAGULATOR SUCT SWTCH 10FR 6 (ELECTROSURGICAL) IMPLANT
ELECT COATED BLADE 2.86 ST (ELECTRODE) ×2 IMPLANT
ELECT REM PT RETURN 9FT ADLT (ELECTROSURGICAL) ×2
ELECT REM PT RETURN 9FT PED (ELECTROSURGICAL)
ELECTRODE REM PT RETRN 9FT PED (ELECTROSURGICAL) IMPLANT
ELECTRODE REM PT RTRN 9FT ADLT (ELECTROSURGICAL) ×1 IMPLANT
GAUZE SPONGE 4X4 16PLY XRAY LF (GAUZE/BANDAGES/DRESSINGS) ×2 IMPLANT
GLOVE SURG SS PI 7.5 STRL IVOR (GLOVE) ×2 IMPLANT
GOWN STRL NON-REIN LRG LVL3 (GOWN DISPOSABLE) ×2 IMPLANT
KIT BASIN OR (CUSTOM PROCEDURE TRAY) ×2 IMPLANT
KIT ROOM TURNOVER OR (KITS) ×2 IMPLANT
NEEDLE HYPO 25GX1X1/2 BEV (NEEDLE) ×2 IMPLANT
NS IRRIG 1000ML POUR BTL (IV SOLUTION) ×2 IMPLANT
PACK SURGICAL SETUP 50X90 (CUSTOM PROCEDURE TRAY) ×2 IMPLANT
PAD ARMBOARD 7.5X6 YLW CONV (MISCELLANEOUS) ×4 IMPLANT
PENCIL BUTTON HOLSTER BLD 10FT (ELECTRODE) ×2 IMPLANT
SPECIMEN JAR SMALL (MISCELLANEOUS) ×2 IMPLANT
SPONGE TONSIL 1 RF SGL (DISPOSABLE) IMPLANT
SUT SILK 2 0 FS (SUTURE) ×2 IMPLANT
SUT VIC AB 3-0 SH 18 (SUTURE) ×2 IMPLANT
SYR 5ML LL (SYRINGE) ×2 IMPLANT
SYR BULB 3OZ (MISCELLANEOUS) ×2 IMPLANT
TOWEL OR 17X24 6PK STRL BLUE (TOWEL DISPOSABLE) ×2 IMPLANT
TUBE CONNECTING 12X1/4 (SUCTIONS) ×2 IMPLANT
TUBE SALEM SUMP 12R W/ARV (TUBING) IMPLANT
WATER STERILE IRR 1000ML POUR (IV SOLUTION) IMPLANT
YANKAUER SUCT BULB TIP NO VENT (SUCTIONS) ×2 IMPLANT

## 2012-09-14 NOTE — Anesthesia Procedure Notes (Signed)
Procedure Name: Intubation Date/Time: 09/14/2012 7:32 AM Performed by: Carmela Rima Pre-anesthesia Checklist: Patient identified, Timeout performed, Emergency Drugs available, Suction available and Patient being monitored Patient Re-evaluated:Patient Re-evaluated prior to inductionOxygen Delivery Method: Circle system utilized Preoxygenation: Pre-oxygenation with 100% oxygen Intubation Type: IV induction and Rapid sequence Ventilation: Mask ventilation without difficulty Laryngoscope Size: Mac and 3 Grade View: Grade I Tube type: Oral Tube size: 7.0 mm Number of attempts: 1 Placement Confirmation: ETT inserted through vocal cords under direct vision,  positive ETCO2 and breath sounds checked- equal and bilateral Secured at: 22 cm Tube secured with: Tape Dental Injury: Teeth and Oropharynx as per pre-operative assessment

## 2012-09-14 NOTE — H&P (Signed)
09/14/2012  Amber Harper  PREOPERATIVE HISTORY AND PHYSICAL  CHIEF COMPLAINT: left tongue mass  HISTORY: This is a 27 year old who presents with a left tongue mass since childhood.  She now presents for excisional biopsy of left lateral tongue mass.  Dr. Emeline Darling, [place] has discussed the risks (bleeding, infection, numbness, tongue weakness, etc.), benefits, and alternatives of this procedure. The patient understands the risks and would like to proceed with the procedure. The chances of success of the procedure are >50% and the patient understands this. I personally performed an examination of the patient within 24 hours of the procedure.  PAST MEDICAL HISTORY: Past Medical History  Diagnosis Date  . Headache(784.0)   . Arthritis     PAST SURGICAL HISTORY: Past Surgical History  Procedure Laterality Date  . Cesarean section      MEDICATIONS: No current facility-administered medications on file prior to encounter.   Current Outpatient Prescriptions on File Prior to Encounter  Medication Sig Dispense Refill  . docusate sodium (COLACE) 100 MG capsule Take 1 capsule (100 mg total) by mouth 2 (two) times daily.  60 capsule  2  . fluticasone (FLONASE) 50 MCG/ACT nasal spray Place 2 sprays into the nose daily.  16 g  6  . loratadine (CLARITIN) 10 MG tablet Take 1 tablet (10 mg total) by mouth daily.  30 tablet  2  . Multiple Vitamins-Minerals (MULTIVITAMIN PO) Take by mouth.        ALLERGIES: No Known Allergies   SOCIAL HISTORY: History   Social History  . Marital Status: Married    Spouse Name: Lars Mage    Number of Children: 2  . Years of Education: 6    Occupational History  . Housewife    Social History Main Topics  . Smoking status: Never Smoker   . Smokeless tobacco: Never Used  . Alcohol Use: No  . Drug Use: No  . Sexual Activity: No   Other Topics Concern  . Not on file   Social History Narrative  . No narrative on file    FAMILY HISTORY: Family History   Problem Relation Age of Onset  . Diabetes Mother   . Diabetes Father     REVIEW OF SYSTEMS:  HEENT: tongue mass, otherwise negative x 10 systems except per HPI  PHYSICAL EXAM:  GENERAL: NAD  VITAL SIGNS:  There were no vitals filed for this visit. SKIN:  Warm, dry HEENT:  Left ~1-2 cm violaceous lateral tongue mass.  NECK:  supple LYMPH:  No LAD LUNGS:  Grossly clear CARDIOVASCULAR:  RRR ABDOMEN:  soft MUSCULOSKELETAL: normal strength PSYCH:  Normal affect NEUROLOGIC:  CN 2-12 intact and symmetric   ASSESSMENT AND PLAN: Plan to proceed with excisional biopsy of left lateral tongue mass. Patient understands the risks, benefits, and alternatives.  09/14/2012  6:03 AM Amber Harper

## 2012-09-14 NOTE — Transfer of Care (Signed)
Immediate Anesthesia Transfer of Care Note  Patient: Amber Harper  Procedure(s) Performed: Procedure(s) with comments: EXCISION MASS (Left) - removal of tongue mass  Patient Location: PACU  Anesthesia Type:General  Level of Consciousness: awake, alert  and oriented  Airway & Oxygen Therapy: Patient Spontanous Breathing and Patient connected to nasal cannula oxygen  Post-op Assessment: Report given to PACU RN, Post -op Vital signs reviewed and stable and Patient moving all extremities X 4  Post vital signs: Reviewed and stable  Complications: No apparent anesthesia complications

## 2012-09-14 NOTE — Progress Notes (Signed)
Receiving report at this time from Elenore Rota CRNA

## 2012-09-14 NOTE — Op Note (Signed)
DATE OF OPERATION: 09/14/2012 Surgeon: Melvenia Beam Procedure Performed: excisional biopsy of left tongue mass 41100-Left  PREOPERATIVE DIAGNOSIS: left tongue mass POSTOPERATIVE DIAGNOSIS: left tongue mass  SURGEON: Melvenia Beam ANESTHESIA: General endotracheal.  ESTIMATED BLOOD LOSS: less than 5 mL.  DRAINS: none SPECIMENS: left tongue mass INDICATIONS: The patient is a 27yo with a history of a left lateral tongue mass for several years DESCRIPTION OF OPERATION: The patient was brought to the operating room and was placed in the supine position and was placed under general endotracheal anesthesia by anesthesiology.   The teeth were opened using the bite block and the tongue was brought forward using a silk retraction suture and the Debakey forceps. I injected the tongue with 10mL of 1% lidocaine with epinephrine. I marked out an elliptical incision around the left sided violaceous mass using the Bovie, and then the mass was removed circumferentially and on the deep/muscular side using the Bovie. I then passed the mass off for frozen section and then obtained hemostasis. I palpated and did not palpate any additional mass and did not visualize any additional mass. I then closed the incision using interrupted simple mucosal 3-0 vicryl sutures. The bite block and tongue retraction stitch were removed and the patient was returned to the care of anesthesia.  The patient was turned back to anesthesia and awakened from anesthesia and extubated without difficulty. The patient tolerated the procedure well with no immediate complications and was taken to the postoperative recovery area in good condition.   Dr. Melvenia Beam was present and performed the entire procedure. 09/14/2012  8:14 AM Melvenia Beam

## 2012-09-14 NOTE — Anesthesia Postprocedure Evaluation (Signed)
Anesthesia Post Note  Patient: Amber Harper  Procedure(s) Performed: Procedure(s) (LRB): EXCISION MASS (Left)  Anesthesia type: General  Patient location: PACU  Post pain: Pain level controlled and Adequate analgesia  Post assessment: Post-op Vital signs reviewed, Patient's Cardiovascular Status Stable, Respiratory Function Stable, Patent Airway and Pain level controlled  Last Vitals:  Filed Vitals:   09/14/12 0906  BP:   Pulse:   Temp: 36.8 C  Resp:     Post vital signs: Reviewed and stable  Level of consciousness: awake, alert  and oriented  Complications: No apparent anesthesia complications

## 2012-09-14 NOTE — Anesthesia Preprocedure Evaluation (Addendum)
Anesthesia Evaluation  Patient identified by MRN, date of birth, ID band Patient awake    Reviewed: Allergy & Precautions, H&P , NPO status , Patient's Chart, lab work & pertinent test results  Airway Mallampati: II  Neck ROM: full    Dental  (+) Poor Dentition and Dental Advidsory Given   Pulmonary          Cardiovascular     Neuro/Psych  Headaches,    GI/Hepatic Pt has a tongue mass   Endo/Other    Renal/GU      Musculoskeletal  (+) Arthritis -,   Abdominal   Peds  Hematology   Anesthesia Other Findings Spoke with patient through interpretor.  Reproductive/Obstetrics                          Anesthesia Physical Anesthesia Plan  ASA: II  Anesthesia Plan: General   Post-op Pain Management:    Induction: Intravenous  Airway Management Planned: Nasal ETT  Additional Equipment:   Intra-op Plan:   Post-operative Plan: Extubation in OR  Informed Consent: I have reviewed the patients History and Physical, chart, labs and discussed the procedure including the risks, benefits and alternatives for the proposed anesthesia with the patient or authorized representative who has indicated his/her understanding and acceptance.   Dental Advisory Given  Plan Discussed with: CRNA, Anesthesiologist and Surgeon  Anesthesia Plan Comments:        Anesthesia Quick Evaluation

## 2012-09-18 ENCOUNTER — Encounter (HOSPITAL_COMMUNITY): Payer: Self-pay | Admitting: Otolaryngology

## 2012-11-09 ENCOUNTER — Ambulatory Visit: Payer: No Typology Code available for payment source

## 2012-11-27 ENCOUNTER — Ambulatory Visit: Payer: No Typology Code available for payment source | Attending: Internal Medicine | Admitting: Internal Medicine

## 2012-11-27 ENCOUNTER — Encounter: Payer: Self-pay | Admitting: Internal Medicine

## 2012-11-27 VITALS — BP 92/60 | HR 73 | Temp 98.5°F | Resp 16 | Ht <= 58 in | Wt 128.0 lb

## 2012-11-27 DIAGNOSIS — N926 Irregular menstruation, unspecified: Secondary | ICD-10-CM

## 2012-11-27 DIAGNOSIS — N912 Amenorrhea, unspecified: Secondary | ICD-10-CM | POA: Insufficient documentation

## 2012-11-27 LAB — CBC WITH DIFFERENTIAL/PLATELET
Basophils Relative: 0 % (ref 0–1)
Eosinophils Absolute: 0.1 10*3/uL (ref 0.0–0.7)
HCT: 40.2 % (ref 36.0–46.0)
MCH: 30.9 pg (ref 26.0–34.0)
MCHC: 35.3 g/dL (ref 30.0–36.0)
MCV: 87.4 fL (ref 78.0–100.0)
Monocytes Absolute: 0.3 10*3/uL (ref 0.1–1.0)
Monocytes Relative: 4 % (ref 3–12)
Neutro Abs: 4.1 10*3/uL (ref 1.7–7.7)
RBC: 4.6 MIL/uL (ref 3.87–5.11)
WBC: 6 10*3/uL (ref 4.0–10.5)

## 2012-11-27 LAB — COMPLETE METABOLIC PANEL WITH GFR
Albumin: 4.8 g/dL (ref 3.5–5.2)
Alkaline Phosphatase: 74 U/L (ref 39–117)
BUN: 14 mg/dL (ref 6–23)
GFR, Est Non African American: >89 mL/min
Glucose, Bld: 94 mg/dL (ref 70–99)
Potassium: 4.4 mEq/L (ref 3.5–5.3)

## 2012-11-27 LAB — LIPID PANEL
HDL: 52 mg/dL (ref >39)
LDL Cholesterol: 76 mg/dL (ref 0–99)
Total CHOL/HDL Ratio: 2.9 Ratio
VLDL: 23 mg/dL (ref 0–40)

## 2012-11-27 NOTE — Progress Notes (Signed)
Pt here with c/o missed menstrual periods x 8 mnths States she was seen at [facility] and prescribed Provera 9/25 x 1o dys but no menstrual Intermit lower abdominal cramping without n/v/fever Spanish language interpretor present Obtained pregnancy test

## 2012-11-27 NOTE — Progress Notes (Signed)
Patient ID: Amber Harper, female   DOB: [birth year], 27 y.o.   MRN: [id number]   CC:  HPI: 27 year old female who presents secondary amenorrhea for the last 8-9 months She complains of abdominal bloating, distention, bilateral flank pain She is not pregnant She also complains of milk-like discharge from her left breast Also complains of occasional headaches   Of note on her previous lipid panel triglycerides are elevated   No Known Allergies Past Medical History  Diagnosis Date  . Headache(784.0)   . Arthritis    Current Outpatient Prescriptions on File Prior to Visit  Medication Sig Dispense Refill  . Multiple Vitamins-Minerals (MULTIVITAMIN PO) Take by mouth.       No current facility-administered medications on file prior to visit.   Family History  Problem Relation Age of Onset  . Diabetes Mother   . Diabetes Father    History   Social History  . Marital Status: Married    Spouse Name: Lars Mage    Number of Children: 2  . Years of Education: 6    Occupational History  . Housewife    Social History Main Topics  . Smoking status: Never Smoker   . Smokeless tobacco: Never Used  . Alcohol Use: No  . Drug Use: No  . Sexual Activity: No   Other Topics Concern  . Not on file   Social History Narrative  . No narrative on file    Review of Systems  Constitutional: Negative for fever, chills, diaphoresis, activity change, appetite change and fatigue.  HENT: Negative for ear pain, nosebleeds, congestion, facial swelling, rhinorrhea, neck pain, neck stiffness and ear discharge.   Eyes: Negative for pain, discharge, redness, itching and visual disturbance.  Respiratory: Negative for cough, choking, chest tightness, shortness of breath, wheezing and stridor.   Cardiovascular: Negative for chest pain, palpitations and leg swelling.  Gastrointestinal: Negative for abdominal distention.  Genitourinary: Negative for dysuria, urgency, frequency, hematuria,  flank pain, decreased urine volume, difficulty urinating and dyspareunia.  Musculoskeletal: Negative for back pain, joint swelling, arthralgias and gait problem.  Neurological: Negative for dizziness, tremors, seizures, syncope, facial asymmetry, speech difficulty, weakness, light-headedness, numbness and headaches.  Hematological: Negative for adenopathy. Does not bruise/bleed easily.  Psychiatric/Behavioral: Negative for hallucinations, behavioral problems, confusion, dysphoric mood, decreased concentration and agitation.    Objective:   Filed Vitals:   11/27/12 1008  BP: 92/60  Pulse: 73  Temp: 98.5 F (36.9 C)  Resp: 16    Physical Exam  Constitutional: Appears well-developed and well-nourished. No distress.  HENT: Normocephalic. External right and left ear normal. Oropharynx is clear and moist.  Eyes: Conjunctivae and EOM are normal. PERRLA, no scleral icterus.  Neck: Normal ROM. Neck supple. No JVD. No tracheal deviation. No thyromegaly.  CVS: RRR, S1/S2 +, no murmurs, no gallops, no carotid bruit.  Pulmonary: Effort and breath sounds normal, no stridor, rhonchi, wheezes, rales.  Abdominal: Soft. BS +,  no distension, tenderness, rebound or guarding.  Musculoskeletal: Normal range of motion. No edema and no tenderness.  Lymphadenopathy: No lymphadenopathy noted, cervical, inguinal. Neuro: Alert. Normal reflexes, muscle tone coordination. No cranial nerve deficit. Skin: Skin is warm and dry. No rash noted. Not diaphoretic. No erythema. No pallor.  Psychiatric: Normal mood and affect. Behavior, judgment, thought content normal.   Lab Results  Component Value Date   WBC 8.3 09/12/2012   HGB 14.3 09/12/2012   HCT 39.0 09/12/2012   MCV 85.3 09/12/2012   PLT 193 09/12/2012   Lab  Results  Component Value Date   CREATININE 0.63 09/12/2012   BUN 12 09/12/2012   NA 135 09/12/2012   K 4.2 09/12/2012   CL 101 09/12/2012   CO2 25 09/12/2012    No results found for this basename:  HGBA1C   Lipid Panel     Component Value Date/Time   CHOL 160 07/04/2012 1335   TRIG 276* 07/04/2012 1335   HDL 42 07/04/2012 1335   CHOLHDL 3.8 07/04/2012 1335   VLDL 55* 07/04/2012 1335   LDLCALC 63 07/04/2012 1335       Assessment and plan:   Patient Active Problem List   Diagnosis Date Noted  . Varicocele 07/10/2012  . 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 Unspecified constipation 07/04/2012   Secondary amenorrhea Pelvic ultrasound Hormonal studies including TSH, prolactin, LH, and FSH CT head with contrast Followup in 3 weeks once these studies are completed If studies are normal the patient will be referred to women's health  Abdominal discomfort Pelvic ultrasound R/O FIBROID , check a UA   Hypertriglyceridemia Recheck lipid panel, if triglycerides still elevated the patient will be started on Niaspan       The patient was given clear instructions to go to ER or return to medical center if symptoms don't improve, worsen or new problems develop. The patient verbalized understanding. The patient was told to call to get any lab results if not heard anything in the next week.

## 2012-11-28 ENCOUNTER — Other Ambulatory Visit: Payer: Self-pay | Admitting: Internal Medicine

## 2012-11-28 ENCOUNTER — Encounter (HOSPITAL_COMMUNITY): Payer: Self-pay

## 2012-11-28 ENCOUNTER — Ambulatory Visit (HOSPITAL_COMMUNITY)
Admission: RE | Admit: 2012-11-28 | Discharge: 2012-11-28 | Disposition: A | Discharge status: Home / Self Care | Payer: No Typology Code available for payment source | Source: Ambulatory Visit | Attending: Internal Medicine | Admitting: Internal Medicine

## 2012-11-28 DIAGNOSIS — N8300 Follicular cyst of ovary, unspecified side: Secondary | ICD-10-CM | POA: Insufficient documentation

## 2012-11-28 DIAGNOSIS — N949 Unspecified condition associated with female genital organs and menstrual cycle: Secondary | ICD-10-CM | POA: Insufficient documentation

## 2012-11-28 DIAGNOSIS — N926 Irregular menstruation, unspecified: Secondary | ICD-10-CM

## 2012-11-28 DIAGNOSIS — R51 Headache: Secondary | ICD-10-CM | POA: Insufficient documentation

## 2012-11-28 LAB — FOLLICLE STIMULATING HORMONE: FSH: 9.7 m[IU]/mL

## 2012-12-13 ENCOUNTER — Ambulatory Visit: Payer: No Typology Code available for payment source | Admitting: Internal Medicine

## 2012-12-20 ENCOUNTER — Encounter: Payer: Self-pay | Admitting: Internal Medicine

## 2012-12-20 ENCOUNTER — Ambulatory Visit: Payer: No Typology Code available for payment source | Attending: Internal Medicine | Admitting: Internal Medicine

## 2012-12-20 VITALS — BP 107/71 | HR 68 | Temp 98.1°F | Resp 16

## 2012-12-20 DIAGNOSIS — N912 Amenorrhea, unspecified: Secondary | ICD-10-CM

## 2012-12-20 DIAGNOSIS — E349 Endocrine disorder, unspecified: Secondary | ICD-10-CM

## 2012-12-20 NOTE — Progress Notes (Signed)
Patient here for her [place] and CT results Was previously seen here because she has not had Her period in 8 months

## 2012-12-20 NOTE — Progress Notes (Signed)
MRN: [id number] Name: Amber Harper  Sex: female Age: 27 y.o. DOB: 17-Sep-1985  Allergies: Review of patient's allergies indicates no known allergies.  Chief Complaint  Patient presents with  . Results    HPI: Patient is 27 y.o. female who comes today for follow up, she is history of amenorrhea she is not pregnant had transvaginal  ultrasound done which was reported to be normal, she also had a blood work done which is normal except for  prolactin elevation, she's not on any medications, as per patient she had some breast milk discharge 2 months ago , she had a CAT scan of head done which is negative for any acute pathology, currently she denies any headache dizziness or any blurry vision.  Past Medical History  Diagnosis Date  . Headache(784.0)   . Arthritis     Past Surgical History  Procedure Laterality Date  . Cesarean section    . Mass excision Left 09/14/2012    Procedure: EXCISION MASS;  Surgeon: Melvenia Beam, MD;  Location: [facility] OR;  Service: ENT;  Laterality: Left;  removal of tongue mass      Medication List       This list is accurate as of: 12/20/12 12:17 PM.  Always use your most recent med list.               MULTIVITAMIN PO  Take by mouth.        No orders of the defined types were placed in this encounter.     There is no immunization history on file for this patient.  Family History  Problem Relation Age of Onset  . Diabetes Mother   . Diabetes Father     History  Substance Use Topics  . Smoking status: Never Smoker   . Smokeless tobacco: Never Used  . Alcohol Use: No    Review of Systems  As noted in HPI  Filed Vitals:   12/20/12 1129  BP: 107/71  Pulse: 68  Temp: 98.1 F (36.7 C)  Resp: 16    Physical Exam  Physical Exam  Constitutional: No distress.  Eyes: EOM are normal. Pupils are equal, round, and reactive to light.  Cardiovascular: Normal rate and regular rhythm.   Pulmonary/Chest: Breath sounds normal.  No respiratory distress. She has no wheezes. She has no rales.    CBC    Component Value Date/Time   WBC 6.0 11/27/2012 1035   RBC 4.60 11/27/2012 1035   HGB 14.2 11/27/2012 1035   HCT 40.2 11/27/2012 1035   PLT 201 11/27/2012 1035   MCV 87.4 11/27/2012 1035   LYMPHSABS 1.5 11/27/2012 1035   MONOABS 0.3 11/27/2012 1035   EOSABS 0.1 11/27/2012 1035   BASOSABS 0.0 11/27/2012 1035    CMP     Component Value Date/Time   NA 140 11/27/2012 1035   K 4.4 11/27/2012 1035   CL 104 11/27/2012 1035   CO2 26 11/27/2012 1035   GLUCOSE 94 11/27/2012 1035   BUN 14 11/27/2012 1035   CREATININE 0.73 11/27/2012 1035   CREATININE 0.63 09/12/2012 1551   CALCIUM 9.7 11/27/2012 1035   PROT 7.5 11/27/2012 1035   ALBUMIN 4.8 11/27/2012 1035   AST 18 11/27/2012 1035   ALT 12 11/27/2012 1035   ALKPHOS 74 11/27/2012 1035   BILITOT 0.5 11/27/2012 1035   GFRNONAA >90 09/12/2012 1551   GFRAA >90 09/12/2012 1551    Lab Results  Component Value Date/Time   CHOL 151 11/27/2012 10:35 AM  No components found with this basename: hga1c    Lab Results  Component Value Date/Time   AST 18 11/27/2012 10:35 AM    Assessment and Plan  Elevated prolactin level -  Patient is not pregnant  not on any medications , TSH level was normal .. Plan: I have orderedMR [facility] tto evaluate her pituitary gland.  Amenorrhea   Likely secondary to elevated prolactin level.  Return in about 3 weeks (around 01/10/2013).  Doris Cheadle, MD

## 2012-12-28 ENCOUNTER — Ambulatory Visit (HOSPITAL_COMMUNITY): Admission: RE | Admit: 2012-12-28 | Payer: No Typology Code available for payment source | Source: Ambulatory Visit

## 2012-12-31 ENCOUNTER — Ambulatory Visit (HOSPITAL_COMMUNITY)
Admission: RE | Admit: 2012-12-31 | Discharge: 2012-12-31 | Disposition: A | Discharge status: Home / Self Care | Payer: No Typology Code available for payment source | Source: Ambulatory Visit | Attending: Internal Medicine | Admitting: Internal Medicine

## 2012-12-31 DIAGNOSIS — D352 Benign neoplasm of pituitary gland: Secondary | ICD-10-CM | POA: Insufficient documentation

## 2012-12-31 DIAGNOSIS — E229 Hyperfunction of pituitary gland, unspecified: Secondary | ICD-10-CM | POA: Insufficient documentation

## 2012-12-31 DIAGNOSIS — R51 Headache: Secondary | ICD-10-CM | POA: Insufficient documentation

## 2012-12-31 MED ORDER — GADOBENATE DIMEGLUMINE 529 MG/ML IV SOLN
10.0000 mL | Freq: Once | INTRAVENOUS | Status: AC | PRN
  Administered 2012-12-31: 7 mL via INTRAVENOUS

## 2013-01-01 ENCOUNTER — Encounter: Payer: Self-pay | Admitting: Internal Medicine

## 2013-01-01 ENCOUNTER — Ambulatory Visit: Payer: No Typology Code available for payment source | Attending: Internal Medicine | Admitting: Internal Medicine

## 2013-01-01 VITALS — BP 106/71 | HR 60 | Temp 98.3°F | Resp 16 | Wt 129.4 lb

## 2013-01-01 DIAGNOSIS — R05 Cough: Secondary | ICD-10-CM

## 2013-01-01 DIAGNOSIS — J029 Acute pharyngitis, unspecified: Secondary | ICD-10-CM | POA: Insufficient documentation

## 2013-01-01 DIAGNOSIS — J020 Streptococcal pharyngitis: Secondary | ICD-10-CM

## 2013-01-01 MED ORDER — AZITHROMYCIN 250 MG PO TABS: ORAL_TABLET | ORAL | Status: DC

## 2013-01-01 MED ORDER — BENZONATATE 100 MG PO CAPS: 100.0000 mg | ORAL_CAPSULE | Freq: Three times a day (TID) | ORAL | Status: DC | PRN

## 2013-01-01 NOTE — Progress Notes (Signed)
Patient complains of sore throat past week States has some puss on the back of her throat

## 2013-01-01 NOTE — Progress Notes (Signed)
MRN: [id number] Name: Amber Harper  Sex: female Age: 27 y.o. DOB: Feb 20, 1985  Allergies: Review of patient's allergies indicates no known allergies.  Chief Complaint  Patient presents with  . Sore Throat    HPI: Patient is 27 y.o. female who comes today reported to have sore throat fever chills cough minimally productive nasal congestion for the last one week, symptoms are not improving denies any chest pain or shortness of breath, denies any nausea vomiting.  Past Medical History  Diagnosis Date  . Headache(784.0)   . Arthritis     Past Surgical History  Procedure Laterality Date  . Cesarean section    . Mass excision Left 09/14/2012    Procedure: EXCISION MASS;  Surgeon: Melvenia Beam, MD;  Location: [facility] OR;  Service: ENT;  Laterality: Left;  removal of tongue mass      Medication List       This list is accurate as of: 01/01/13 12:00 PM.  Always use your most recent med list.               azithromycin 250 MG tablet  Commonly known as:  ZITHROMAX Z-PAK  Take as directed     benzonatate 100 MG capsule  Commonly known as:  TESSALON  Take 1 capsule (100 mg total) by mouth 3 (three) times daily as needed for cough.     MULTIVITAMIN PO  Take by mouth.        Meds ordered this encounter  Medications  . azithromycin (ZITHROMAX Z-PAK) 250 MG tablet    Sig: Take as directed    Dispense:  6 each    Refill:  0  . benzonatate (TESSALON) 100 MG capsule    Sig: Take 1 capsule (100 mg total) by mouth 3 (three) times daily as needed for cough.    Dispense:  30 capsule    Refill:  1     There is no immunization history on file for this patient.  Family History  Problem Relation Age of Onset  . Diabetes Mother   . Diabetes Father     History  Substance Use Topics  . Smoking status: Never Smoker   . Smokeless tobacco: Never Used  . Alcohol Use: No    Review of Systems  As noted in HPI  Filed Vitals:   01/01/13 1137  BP: 106/71  Pulse: 60   Temp: 98.3 F (36.8 C)  Resp: 16    Physical Exam  Physical Exam  Constitutional: No distress.  HENT:  Minimal sinus tenderness, pharyngeal erythema enlarged tonsils no exudate    Eyes: EOM are normal. Pupils are equal, round, and reactive to light.  Cardiovascular: Normal rate and regular rhythm.   Pulmonary/Chest: Breath sounds normal. No respiratory distress. She has no wheezes. She has no rales.  Lymphadenopathy:    She has cervical adenopathy.    CBC    Component Value Date/Time   WBC 6.0 11/27/2012 1035   RBC 4.60 11/27/2012 1035   HGB 14.2 11/27/2012 1035   HCT 40.2 11/27/2012 1035   PLT 201 11/27/2012 1035   MCV 87.4 11/27/2012 1035   LYMPHSABS 1.5 11/27/2012 1035   MONOABS 0.3 11/27/2012 1035   EOSABS 0.1 11/27/2012 1035   BASOSABS 0.0 11/27/2012 1035    CMP     Component Value Date/Time   NA 140 11/27/2012 1035   K 4.4 11/27/2012 1035   CL 104 11/27/2012 1035   CO2 26 11/27/2012 1035   GLUCOSE 94 11/27/2012 1035  BUN 14 11/27/2012 1035   CREATININE 0.73 11/27/2012 1035   CREATININE 0.63 09/12/2012 1551   CALCIUM 9.7 11/27/2012 1035   PROT 7.5 11/27/2012 1035   ALBUMIN 4.8 11/27/2012 1035   AST 18 11/27/2012 1035   ALT 12 11/27/2012 1035   ALKPHOS 74 11/27/2012 1035   BILITOT 0.5 11/27/2012 1035   GFRNONAA >90 09/12/2012 1551   GFRAA >90 09/12/2012 1551    Lab Results  Component Value Date/Time   CHOL 151 11/27/2012 10:35 AM    No components found with this basename: hga1c    Lab Results  Component Value Date/Time   AST 18 11/27/2012 10:35 AM    Assessment and Plan  Pharyngitis - Plan: azithromycin (ZITHROMAX Z-PAK) 250 MG tablet  Cough - Plan: benzonatate (TESSALON) 100 MG capsule  Streptococcal sore throat - Plan: Rapid Strep A negative , will send for culture   Advised for salt water gargles   Follow up as scheduled   Doris Cheadle, MD

## 2013-01-03 LAB — CULTURE, GROUP A STREP: Organism ID, Bacteria: NORMAL

## 2013-01-21 ENCOUNTER — Telehealth: Payer: Self-pay

## 2013-01-21 DIAGNOSIS — R7989 Other specified abnormal findings of blood chemistry: Secondary | ICD-10-CM

## 2013-01-21 NOTE — Telephone Encounter (Signed)
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 ------

## 2013-01-21 NOTE — Telephone Encounter (Signed)
Interpreter line used Patient is aware of results Referral for endocrinology placed

## 2013-01-22 ENCOUNTER — Ambulatory Visit: Payer: No Typology Code available for payment source | Attending: Internal Medicine | Admitting: Internal Medicine

## 2013-01-22 ENCOUNTER — Encounter: Payer: Self-pay | Admitting: Internal Medicine

## 2013-01-22 VITALS — BP 111/73 | HR 65 | Temp 98.7°F | Resp 14 | Ht <= 58 in | Wt 128.6 lb

## 2013-01-22 DIAGNOSIS — D353 Benign neoplasm of craniopharyngeal duct: Secondary | ICD-10-CM

## 2013-01-22 DIAGNOSIS — D352 Benign neoplasm of pituitary gland: Secondary | ICD-10-CM

## 2013-01-22 DIAGNOSIS — J029 Acute pharyngitis, unspecified: Secondary | ICD-10-CM | POA: Insufficient documentation

## 2013-01-22 NOTE — Progress Notes (Signed)
Patient ID: Amber Harper, female   DOB: 06-Jun-1985, 28 y.o.   MRN: [id number]   CC:  HPI:   28 year old female here to followup for her microadenoma. The patient also complains of something at the back of her throat. The patient recently had a pharyngitis in December. She denies any difficulty swallowing, she denies feeling any lymph nodes in her neck.   No Known Allergies Past Medical History  Diagnosis Date  . Headache(784.0)   . Arthritis    Current Outpatient Prescriptions on File Prior to Visit  Medication Sig Dispense Refill  . Multiple Vitamins-Minerals (MULTIVITAMIN PO) Take by mouth.      Marland Kitchen azithromycin (ZITHROMAX Z-PAK) 250 MG tablet Take as directed  6 each  0  . benzonatate (TESSALON) 100 MG capsule Take 1 capsule (100 mg total) by mouth 3 (three) times daily as needed for cough.  30 capsule  1   No current facility-administered medications on file prior to visit.   Family History  Problem Relation Age of Onset  . Diabetes Mother   . Diabetes Father    History   Social History  . Marital Status: Married    Spouse Name: [name]    Number of Children: 2  . Years of Education: 6    Occupational History  . Housewife    Social History Main Topics  . Smoking status: Never Smoker   . Smokeless tobacco: Never Used  . Alcohol Use: No  . Drug Use: No  . Sexual Activity: No   Other Topics Concern  . Not on file   Social History Narrative  . No narrative on file    Review of Systems  Constitutional: Negative for fever, chills, diaphoresis, activity change, appetite change and fatigue.  HENT: As in history of present illness Eyes: Negative for pain, discharge, redness, itching and visual disturbance.  Respiratory: Negative for cough, choking, chest tightness, shortness of breath, wheezing and stridor.   Cardiovascular: Negative for chest pain, palpitations and leg swelling.  Gastrointestinal: Negative for abdominal distention.  Genitourinary:  Negative for dysuria, urgency, frequency, hematuria, flank pain, decreased urine volume, difficulty urinating and dyspareunia.  Musculoskeletal: Negative for back pain, joint swelling, arthralgias and gait problem.  Neurological: Negative for dizziness, tremors, seizures, syncope, facial asymmetry, speech difficulty, weakness, light-headedness, numbness and headaches.  Hematological: Negative for adenopathy. Does not bruise/bleed easily.  Psychiatric/Behavioral: Negative for hallucinations, behavioral problems, confusion, dysphoric mood, decreased concentration and agitation.    Objective:   Filed Vitals:   01/22/13 1210  BP: 111/73  Pulse: 65  Temp: 98.7 F (37.1 C)  Resp: 14    Physical Exam  Constitutional: Appears well-developed and well-nourished. No distress.  HENT: Normocephalic. External right and left ear normal. Oropharynx is clear and moist.  Eyes: Conjunctivae and EOM are normal. PERRLA, no scleral icterus.  Neck: Normal ROM. Neck supple. No JVD. No tracheal deviation. No thyromegaly.  CVS: RRR, S1/S2 +, no murmurs, no gallops, no carotid bruit.  Pulmonary: Effort and breath sounds normal, no stridor, rhonchi, wheezes, rales.  Abdominal: Soft. BS +,  no distension, tenderness, rebound or guarding.  Musculoskeletal: Normal range of motion. No edema and no tenderness.  Lymphadenopathy: No lymphadenopathy noted, cervical, inguinal. Neuro: Alert. Normal reflexes, muscle tone coordination. No cranial nerve deficit. Skin: Skin is warm and dry. No rash noted. Not diaphoretic. No erythema. No pallor.  Psychiatric: Normal mood and affect. Behavior, judgment, thought content normal.   Lab Results  Component Value Date   WBC  6.0 11/27/2012   HGB 14.2 11/27/2012   HCT 40.2 11/27/2012   MCV 87.4 11/27/2012   PLT 201 11/27/2012   Lab Results  Component Value Date   CREATININE 0.73 11/27/2012   BUN 14 11/27/2012   NA 140 11/27/2012   K 4.4 11/27/2012   CL 104 11/27/2012    CO2 26 11/27/2012    No results found for this basename: HGBA1C   Lipid Panel     Component Value Date/Time   CHOL 151 11/27/2012 1035   TRIG 115 11/27/2012 1035   HDL 52 11/27/2012 1035   CHOLHDL 2.9 11/27/2012 1035   VLDL 23 11/27/2012 1035   LDLCALC 76 11/27/2012 1035       Assessment and plan:   Patient Active Problem List   Diagnosis Date Noted  . Varicocele 07/10/2012  . 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 Unspecified constipation 07/04/2012   Pharyngitis Improving Her oropharyngeal exam is completely normal I do not see any suspicious lesions at the back of her throat No palpable lymphadenopathy   Pituitary microadenoma Recommended patient to followup with endocrinology        The patient was given clear instructions to go to ER or return to medical center if symptoms don't improve, worsen or new problems develop. The patient verbalized understanding. The patient was told to call to get any lab results if not heard anything in the next week.

## 2013-01-22 NOTE — Progress Notes (Signed)
Pt is here for a f/u. Complains of a red bump on the back of her tongue; roughness on and off in throat since last appointment. Wasn't noticed during last appointment. Also complains of headaches and bump is getting worse; has little energy and feeling tired. Pt has an interpreter.

## 2013-01-30 ENCOUNTER — Ambulatory Visit: Payer: No Typology Code available for payment source | Admitting: Endocrinology

## 2013-02-04 ENCOUNTER — Ambulatory Visit: Payer: No Typology Code available for payment source | Attending: Internal Medicine

## 2013-02-09 ENCOUNTER — Encounter (HOSPITAL_COMMUNITY): Payer: Self-pay | Admitting: Emergency Medicine

## 2013-02-09 ENCOUNTER — Emergency Department (HOSPITAL_COMMUNITY)
Admission: EM | Admit: 2013-02-09 | Discharge: 2013-02-09 | Disposition: A | Discharge status: Home / Self Care | Payer: No Typology Code available for payment source | Source: Home / Self Care | Attending: Family Medicine | Admitting: Family Medicine

## 2013-02-09 DIAGNOSIS — J069 Acute upper respiratory infection, unspecified: Secondary | ICD-10-CM

## 2013-02-09 LAB — POCT RAPID STREP A: STREPTOCOCCUS, GROUP A SCREEN (DIRECT): NEGATIVE

## 2013-02-09 MED ORDER — TRAMADOL HCL 50 MG PO TABS: 50.0000 mg | ORAL_TABLET | Freq: Four times a day (QID) | ORAL | Status: DC | PRN

## 2013-02-09 MED ORDER — PREDNISONE 10 MG PO TABS: 30.0000 mg | ORAL_TABLET | Freq: Every day | ORAL | Status: DC

## 2013-02-09 NOTE — ED Notes (Signed)
Pt c/o cold sxs onset 4 days Sxs include: cough, ST, hurts to swallow, congestion Denies f/v/n/d, SOB, wheezing Alert w/no signs of acute distress

## 2013-02-09 NOTE — Discharge Instructions (Signed)
Gracias por venir hoy.

## 2013-02-09 NOTE — ED Provider Notes (Signed)
Amber Harper is a 28 y.o. female who presents to Urgent Care today for cough and congestion. Patient has experienced 4 days of cough sore throat congestion and pain with swallowing. She denies any fevers or chills or significant shortness of breath. No nausea vomiting or diarrhea. Sick contacts at home. Patient has tried some over-the-counter pain medications which helped a little. She feels well otherwise.   Past Medical History  Diagnosis Date  . Headache(784.0)   . Arthritis    History  Substance Use Topics  . Smoking status: Never Smoker   . Smokeless tobacco: Never Used  . Alcohol Use: No   ROS as above Medications: No current facility-administered medications for this encounter.   Current Outpatient Prescriptions  Medication Sig Dispense Refill  . Multiple Vitamins-Minerals (MULTIVITAMIN PO) Take by mouth.      . predniSONE (DELTASONE) 10 MG tablet Take 3 tablets (30 mg total) by mouth daily. spanish  15 tablet  0  . traMADol (ULTRAM) 50 MG tablet Take 1 tablet (50 mg total) by mouth every 6 (six) hours as needed (cough). spanish  10 tablet  0    Exam:  BP 105/59  Pulse 68  Temp(Src) 98.2 F (36.8 C) (Oral)  Resp 18  SpO2 96%  LMP 04/22/2012 Gen: Well NAD HEENT: EOMI,  MMM posterior pharynx with cobblestoning. Tympanic membranes are normal appearing bilaterally.  Lungs: Normal work of breathing. CTABL Heart: RRR no MRG Abd: NABS, Soft. NT, ND Exts: Brisk capillary refill, warm and well perfused.   Results for orders placed during the hospital encounter of 02/09/13 (from the past 24 hour(s))  POCT RAPID STREP A ([place])     Status: None   Collection Time    02/09/13 10:30 AM      Result Value Range   Streptococcus, Group A Screen (Direct) NEGATIVE  NEGATIVE   No results found.  Assessment and Plan: 28 y.o. female with viral URI and pharyngitis. Plan to treat with prednisone and tramadol for cough. Followup as needed.  Discussed warning  signs or symptoms. Please see discharge instructions. Patient expresses understanding.    Gregor Hams, MD 02/09/13 1055

## 2013-02-11 ENCOUNTER — Ambulatory Visit (INDEPENDENT_AMBULATORY_CARE_PROVIDER_SITE_OTHER): Payer: No Typology Code available for payment source | Admitting: Endocrinology

## 2013-02-11 ENCOUNTER — Encounter: Payer: Self-pay | Admitting: Endocrinology

## 2013-02-11 VITALS — BP 112/68 | HR 74 | Ht <= 58 in | Wt 131.8 lb

## 2013-02-11 DIAGNOSIS — D353 Benign neoplasm of craniopharyngeal duct: Secondary | ICD-10-CM

## 2013-02-11 DIAGNOSIS — D352 Benign neoplasm of pituitary gland: Secondary | ICD-10-CM | POA: Insufficient documentation

## 2013-02-11 HISTORY — DX: Benign neoplasm of pituitary gland: D35.2

## 2013-02-11 LAB — CULTURE, GROUP A STREP

## 2013-02-11 MED ORDER — CABERGOLINE 0.5 MG PO TABS: 0.5000 mg | ORAL_TABLET | ORAL | Status: DC

## 2013-02-11 NOTE — Patient Instructions (Signed)
Dostinex 1/2 tablet twice a week for 2 weeks then 1 tab 2x per week

## 2013-02-11 NOTE — Progress Notes (Signed)
[place]   Chief complaint: Lack of menstrual cycles since 04/2012   History of Present Illness:   Patient has had amenorrhea since 4/14 after stopping her birth control pill  In the past she has not had any significant difficulty with fertility and her last child was born 3 years ago. After this she was on birth control pills. Also since her last childbirth she has had some degree of galactorrhea, mostly on expressing milk from her breasts. In the last 3 months she would sometimes also have spontaneous milk discharge.  She was evaluated for above symptoms and found to have an elevated prolactin level of 227 in 11/2012. 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 She is now referred here by her primary care physician for further management      Medication List       This list is accurate as of: 02/11/13 11:29 AM.  Always use your most recent med list.               predniSONE 10 MG tablet  Commonly known as:  DELTASONE  Take 3 tablets (30 mg total) by mouth daily. spanish     SUPER B COMPLEX PO  Take by mouth.     traMADol 50 MG tablet  Commonly known as:  ULTRAM  Take 1 tablet (50 mg total) by mouth every 6 (six) hours as needed (cough). spanish        Allergies: No Known Allergies  Past Medical History  Diagnosis Date  . Headache(784.0)   . Arthritis     Past Surgical History  Procedure Laterality Date  . Cesarean section    . Mass excision Left 09/14/2012    Procedure: EXCISION MASS;  Surgeon: Ruby Cola, MD;  Location: [facility] OR;  Service: ENT;  Laterality: Left;  removal of tongue mass    Family History  Problem Relation Age of Onset  . Diabetes Mother   . Diabetes Father     Social History:  reports that she has never smoked. She has never used smokeless tobacco. She reports that she does not drink alcohol or use illicit drugs.  REVIEW Of SYSTEMS:  She has had headaches for the last 2-3 months.  Initially these were occasional and these are occurring almost daily now. Most of these headaches are on the left side or all over her head but sometimes in the front also. Occasionally may have some nausea with this  No history of hypertension  No history of gestational diabetes  She has been feeling more tired for 6 months or so   Her weight has gone up gradually. Previously was 110 pounds before her second pregnancy  Wt Readings from Last 3 Encounters:  02/11/13 131 lb 12.8 oz (59.784 kg)  01/22/13 128 lb 9.6 oz (58.333 kg)  01/01/13 129 lb 6.4 oz (58.695 kg)    Examination:   BP 112/68  Pulse 74  Ht 4' 8.5" (1.435 m)  Wt 131 lb 12.8 oz (59.784 kg)  BMI 29.03 kg/m2  SpO2 95%  LMP 04/22/2012   Physical Exam  Constitutional: She appears well-developed and well-nourished.  Eyes:  Fundoscopic exam:      The right eye shows no papilledema.       The left eye shows no papilledema.  Normal discs  Neck: No thyromegaly present.  No lymphadenopathy Has mild acanthosis of posterior neck  Cardiovascular: Regular rhythm and normal heart sounds.   Musculoskeletal: She exhibits no  edema.  Neurological:  Reflex Scores:      Bicep reflexes are 2+ on the right side and 2+ on the left side.   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 Advised her that she can be treated medically with drugs and this should usually resolve the condition. She may be needing to take medications for a few years Also discussed the fact that she should try to avoid pregnancy with barrier birth control methods until the condition is resolved Given her a handout on prolactinoma from Up -to-date  She will start taking Dostinex 0.5 mg, half tablet twice a week for the first 2 weeks and then one  tablet twice a week. She will followup in 6 weeks and her medication dosage will be adjusted to keep prolactin in the normal range Discussed possible side effects   Amber Harper 02/11/2013, 11:29 AM

## 2013-03-22 ENCOUNTER — Encounter: Payer: Self-pay | Admitting: Internal Medicine

## 2013-03-22 ENCOUNTER — Ambulatory Visit: Payer: No Typology Code available for payment source | Attending: Internal Medicine | Admitting: Internal Medicine

## 2013-03-22 VITALS — BP 107/74 | HR 66 | Temp 98.3°F | Resp 17

## 2013-03-22 DIAGNOSIS — D352 Benign neoplasm of pituitary gland: Secondary | ICD-10-CM | POA: Insufficient documentation

## 2013-03-22 DIAGNOSIS — D353 Benign neoplasm of craniopharyngeal duct: Principal | ICD-10-CM

## 2013-03-22 DIAGNOSIS — Z0000 Encounter for general adult medical examination without abnormal findings: Secondary | ICD-10-CM

## 2013-03-22 DIAGNOSIS — R350 Frequency of micturition: Secondary | ICD-10-CM

## 2013-03-22 DIAGNOSIS — K649 Unspecified hemorrhoids: Secondary | ICD-10-CM

## 2013-03-22 LAB — POCT URINALYSIS DIPSTICK
Bilirubin, UA: NEGATIVE
Glucose, UA: NEGATIVE
Ketones, UA: NEGATIVE
Leukocytes, UA: NEGATIVE
Nitrite, UA: NEGATIVE
PROTEIN UA: NEGATIVE
SPEC GRAV UA: 1.02
UROBILINOGEN UA: 0.2
pH, UA: 7.5

## 2013-03-22 MED ORDER — HYDROCORTISONE ACETATE 25 MG RE SUPP: 25.0000 mg | Freq: Two times a day (BID) | RECTAL | Status: DC

## 2013-03-22 NOTE — Progress Notes (Signed)
Patient here for follow up Has history of pituitary adenoma which is being treated with medication

## 2013-03-22 NOTE — Progress Notes (Signed)
MRN: [id number] Name: Amber Harper  Sex: female Age: 28 y.o. DOB: [birth year]  Allergies: Review of patient's allergies indicates no known allergies.  Chief Complaint  Patient presents with  . Follow-up    HPI: Patient is 28 y.o. female who has recently been diagnosed with prolactinoma, following up with endocrinologist and has been started on medication, she recently reported to have urinary frequency denies any dysuria also history of hemorrhoids noticed some blood while wiping, denies any nausea vomiting any change in bowel habits. Patient denies any fever chills chest pain shortness of breath.  Past Medical History  Diagnosis Date  . Headache(784.0)   . Arthritis     Past Surgical History  Procedure Laterality Date  . Cesarean section    . Mass excision Left 09/14/2012    Procedure: EXCISION MASS;  Surgeon: Ruby Cola, MD;  Location: [facility] OR;  Service: ENT;  Laterality: Left;  removal of tongue mass      Medication List       This list is accurate as of: 03/22/13 12:50 PM.  Always use your most recent med list.               cabergoline 0.5 MG tablet  Commonly known as:  DOSTINEX  Take 1 tablet (0.5 mg total) by mouth 2 (two) times a week.     hydrocortisone 25 MG suppository  Commonly known as:  ANUSOL-HC  Place 1 suppository (25 mg total) rectally 2 (two) times daily.     predniSONE 10 MG tablet  Commonly known as:  DELTASONE  Take 3 tablets (30 mg total) by mouth daily. spanish     SUPER B COMPLEX PO  Take by mouth.     traMADol 50 MG tablet  Commonly known as:  ULTRAM  Take 1 tablet (50 mg total) by mouth every 6 (six) hours as needed (cough). spanish        Meds ordered this encounter  Medications  . hydrocortisone (ANUSOL-HC) 25 MG suppository    Sig: Place 1 suppository (25 mg total) rectally 2 (two) times daily.    Dispense:  12 suppository    Refill:  0     There is no immunization history on file for this  patient.  Family History  Problem Relation Age of Onset  . Diabetes Mother   . Diabetes Father   . Diabetes Maternal Grandmother   . Diabetes Paternal Grandmother     History  Substance Use Topics  . Smoking status: Never Smoker   . Smokeless tobacco: Never Used  . Alcohol Use: No    Review of Systems   As noted in HPI  Filed Vitals:   03/22/13 1214  BP: 107/74  Pulse: 66  Temp: 98.3 F (36.8 C)  Resp: 17    Physical Exam  Physical Exam  Constitutional: No distress.  Eyes: EOM are normal. Pupils are equal, round, and reactive to light.  Cardiovascular: Normal rate and regular rhythm.   Pulmonary/Chest: Breath sounds normal. No respiratory distress. She has no wheezes. She has no rales.  Abdominal: Soft. There is no tenderness.    CBC    Component Value Date/Time   WBC 6.0 11/27/2012 1035   RBC 4.60 11/27/2012 1035   HGB 14.2 11/27/2012 1035   HCT 40.2 11/27/2012 1035   PLT 201 11/27/2012 1035   MCV 87.4 11/27/2012 1035   LYMPHSABS 1.5 11/27/2012 1035   MONOABS 0.3 11/27/2012 1035   EOSABS 0.1 11/27/2012 1035  BASOSABS 0.0 11/27/2012 1035    CMP     Component Value Date/Time   NA 140 11/27/2012 1035   K 4.4 11/27/2012 1035   CL 104 11/27/2012 1035   CO2 26 11/27/2012 1035   GLUCOSE 94 11/27/2012 1035   BUN 14 11/27/2012 1035   CREATININE 0.73 11/27/2012 1035   CREATININE 0.63 09/12/2012 1551   CALCIUM 9.7 11/27/2012 1035   PROT 7.5 11/27/2012 1035   ALBUMIN 4.8 11/27/2012 1035   AST 18 11/27/2012 1035   ALT 12 11/27/2012 1035   ALKPHOS 74 11/27/2012 1035   BILITOT 0.5 11/27/2012 1035   GFRNONAA >90 09/12/2012 1551   GFRAA >90 09/12/2012 1551    Lab Results  Component Value Date/Time   CHOL 151 11/27/2012 10:35 AM    No components found with this basename: hga1c    Lab Results  Component Value Date/Time   AST 18 11/27/2012 10:35 AM    Assessment and Plan  Urine frequency - Plan: Urinalysis Dipstick  Hemorrhoid - Plan:  hydrocortisone (ANUSOL-HC) 25 MG suppository    Return in about 6 months (around 09/22/2013), or if symptoms worsen or fail to improve.  Lorayne Marek, MD

## 2013-03-25 ENCOUNTER — Other Ambulatory Visit: Payer: No Typology Code available for payment source

## 2013-03-25 DIAGNOSIS — D352 Benign neoplasm of pituitary gland: Secondary | ICD-10-CM

## 2013-03-26 LAB — PROLACTIN: Prolactin: 10.3 ng/mL

## 2013-03-28 ENCOUNTER — Ambulatory Visit (INDEPENDENT_AMBULATORY_CARE_PROVIDER_SITE_OTHER): Payer: No Typology Code available for payment source | Admitting: Endocrinology

## 2013-03-28 ENCOUNTER — Encounter: Payer: Self-pay | Admitting: Endocrinology

## 2013-03-28 VITALS — BP 128/70 | HR 76 | Temp 97.7°F | Resp 14 | Ht <= 58 in | Wt 128.6 lb

## 2013-03-28 DIAGNOSIS — D352 Benign neoplasm of pituitary gland: Secondary | ICD-10-CM

## 2013-03-28 DIAGNOSIS — D353 Benign neoplasm of craniopharyngeal duct: Secondary | ICD-10-CM

## 2013-03-28 NOTE — Patient Instructions (Signed)
Reduce tablet to 1/2 twice a week when menses start  Reducir la tableta a media dos veces por semana cuando la menstruacin comienzan

## 2013-03-28 NOTE — Progress Notes (Signed)
Amber Harper   Chief complaint: Followup of amenorrhea  History of Present Illness:   She has had amenorrhea since 4/14 after stopping her birth control pill  Also since her last childbirth she had some degree of galactorrhea, mostly on expressing milk from her breasts. She was evaluated for above symptoms and found to have an elevated prolactin level of 227 in 11/2012. 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 Did have a little soreness of her nipple on the left side recently. Also now does not have headaches  Her prolactin level is now improved  No visits with results within 2 Day(s) from this visit. Latest known visit with results is:  Appointment on 03/25/2013  Component Date Value Ref Range Status  . Prolactin 03/25/2013 10.3   Final   Comment:      Reference Ranges:                                           Female:                       2.1 -  17.1 ng/ml                                           Female:   Pregnant          9.7 - 208.5 ng/mL                                                     Non Pregnant      2.8 -  29.2 ng/mL                                                     Post Menopausal   1.8 -  20.3 ng/mL                                                      Medication List       This list is accurate as of: 03/28/13  8:41 AM.  Always use your most recent med list.               cabergoline 0.5 MG tablet  Commonly known as:  DOSTINEX  Take 1 tablet (0.5 mg total) by mouth 2 (two) times a week.     hydrocortisone 25 MG suppository  Commonly known as:  ANUSOL-HC  Place 1  suppository (25 mg total) rectally 2 (two) times daily.     predniSONE 10 MG tablet  Commonly known as:  DELTASONE  Take 3 tablets (30 mg total) by mouth daily. spanish     SUPER B COMPLEX PO  Take by mouth.     traMADol 50 MG tablet  Commonly known as:  ULTRAM  Take 1 tablet (50 mg total) by mouth every 6 (six) hours as needed (cough). spanish        Allergies: No Known Allergies  Past Medical History  Diagnosis Date  . Headache(784.0)   . Arthritis     Past Surgical History  Procedure Laterality Date  . Cesarean section    . Mass excision Left 09/14/2012    Procedure: EXCISION MASS;  Surgeon: Ruby Cola, MD;  Location: [facility] OR;  Service: ENT;  Laterality: Left;  removal of tongue mass    Family History  Problem Relation Age of Onset  . Diabetes Mother   . Diabetes Father   . Diabetes Maternal Grandmother   . Diabetes Paternal Grandmother     Social History:  reports that she has never smoked. She has never used smokeless tobacco. She reports that she does not drink alcohol or use illicit drugs.  REVIEW Of SYSTEMS:  She has had headaches for the last 2-3 months. Initially these were occasional and these are occurring almost daily now. Most of these headaches are on the left side or all over her head but sometimes in the front also. Occasionally may have some nausea with this  No history of hypertension  No history of gestational diabetes  She has been feeling more tired for 6 months or so   Her weight has gone up gradually. 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 She will followup in 3 months with repeat prolactin   Zadok Holaway 03/28/2013, 8:41 AM

## 2013-05-30 ENCOUNTER — Encounter: Payer: No Typology Code available for payment source | Admitting: Medical

## 2013-05-31 ENCOUNTER — Telehealth: Payer: Self-pay | Admitting: Endocrinology

## 2013-05-31 ENCOUNTER — Other Ambulatory Visit: Payer: Self-pay | Admitting: *Deleted

## 2013-05-31 MED ORDER — CABERGOLINE 0.5 MG PO TABS: 0.5000 mg | ORAL_TABLET | ORAL | Status: DC

## 2013-05-31 NOTE — Telephone Encounter (Signed)
Patient has no more refills in pharmacy   Cabergoline   Thank You :)

## 2013-05-31 NOTE — Telephone Encounter (Signed)
rx sent

## 2013-06-24 ENCOUNTER — Other Ambulatory Visit: Payer: Self-pay

## 2013-06-24 DIAGNOSIS — D352 Benign neoplasm of pituitary gland: Secondary | ICD-10-CM

## 2013-06-25 LAB — PROLACTIN: Prolactin: 10.5 ng/mL

## 2013-06-27 ENCOUNTER — Ambulatory Visit (INDEPENDENT_AMBULATORY_CARE_PROVIDER_SITE_OTHER): Payer: Self-pay | Admitting: Endocrinology

## 2013-06-27 ENCOUNTER — Encounter: Payer: Self-pay | Admitting: Endocrinology

## 2013-06-27 ENCOUNTER — Telehealth: Payer: Self-pay | Admitting: Endocrinology

## 2013-06-27 VITALS — BP 102/60 | HR 64 | Temp 98.1°F | Resp 14 | Ht <= 58 in | Wt 124.6 lb

## 2013-06-27 DIAGNOSIS — D352 Benign neoplasm of pituitary gland: Secondary | ICD-10-CM

## 2013-06-27 DIAGNOSIS — D353 Benign neoplasm of craniopharyngeal duct: Secondary | ICD-10-CM

## 2013-06-27 MED ORDER — CABERGOLINE 0.5 MG PO TABS: 0.5000 mg | ORAL_TABLET | ORAL | Status: DC

## 2013-06-27 NOTE — Progress Notes (Signed)
Amber Harper   Chief complaint: Followup of amenorrhea  History of Present Illness:   She has had amenorrhea since 4/14 after stopping her birth control pill  Also since her last childbirth she had some degree of galactorrhea, mostly on expressing milk from her breasts. She was evaluated for above symptoms and found to have an elevated prolactin level of 227 in 11/2012. 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  She was started on Dostinex on 02/12/13 and was taking a half tablet twice a week for the first 2 weeks and subsequently one tablet twice a week Because of some nausea and little dizziness with the higher dose she was told to reduce the tablet to a half when she started having her menstrual cycles again  Menses have resumed since early April and appeared to be normal  She is tolerating the Dostinex 1/2 tablet twice a week Also did not have any galactorrhea now She has not used any contraceptives and currently does not plan a pregnancy in the near future but may do so later  Her prolactin level is now stable even with the lower dose  Lab Results  Component Value Date   PROLACTIN 10.5 06/24/2013   PROLACTIN 10.3 03/25/2013   PROLACTIN 227.2* 11/27/2012        Medication List       This list is accurate as of: 06/27/13  8:32 AM.  Always use your most recent med list.               cabergoline 0.5 MG tablet  Commonly known as:  DOSTINEX  Take 0.5 mg by mouth 2 (two) times a week. Takes 1/2 tablet 2 times a week     SUPER B COMPLEX PO  Take by mouth.        Allergies: No Known Allergies  Past Medical History  Diagnosis Date  . Headache(784.0)   . Arthritis     Past Surgical History  Procedure Laterality Date  . Cesarean section    . Mass excision Left 09/14/2012    Procedure: EXCISION MASS;  Surgeon: Ruby Cola, MD;  Location: [facility] OR;  Service: ENT;  Laterality: Left;  removal of tongue mass    Family  History  Problem Relation Age of Onset  . Diabetes Mother   . Diabetes Father   . Diabetes Maternal Grandmother   . Diabetes Paternal Grandmother     Social History:  reports that she has never smoked. She has never used smokeless tobacco. She reports that she does not drink alcohol or use illicit drugs.  REVIEW Of SYSTEMS:   No history of gestational diabetes  She had been feeling more tired  but this is somewhat better  Her weight has improved  Wt Readings from Last 3 Encounters:  06/27/13 124 lb 9.6 oz (56.518 kg)  03/28/13 128 lb 9.6 oz (58.333 kg)  02/11/13 131 lb 12.8 oz (59.784 kg)     Physical Exam BP 102/60  Pulse 64  Temp(Src) 98.1 F (36.7 C)  Resp 14  Ht 4' 8.5" (1.435 m)  Wt 124 lb 9.6 oz (56.518 kg)  BMI 27.45 kg/m2  SpO2 98%  Not indicated  Assessment/Plan:   Prolactinoma with baseline increased prolactin level of 227 and symptoms of amenorrhea, nonspecific headaches and galactorrhea  She has had no galactorrhea and also has started her menstrual cycle with Dostinex half tablet twice a week Prolactin is much improved and stable at 10  She will continue the same dose Have emphasized the need for prevention of pregnancy with barrier contraceptives and to avoid pregnancy for at least a year because of her prolactinoma  Followup in 4 months   Adrienna Karis 06/27/2013, 8:32 AM

## 2013-06-27 NOTE — Telephone Encounter (Signed)
Cathy call to verify direction for Cabergoline. Please call her

## 2013-06-27 NOTE — Patient Instructions (Signed)
Use contraception and avoid pregnancy for at least 1 year

## 2013-07-18 ENCOUNTER — Encounter: Payer: Self-pay | Admitting: Internal Medicine

## 2013-07-18 ENCOUNTER — Ambulatory Visit: Payer: Self-pay | Attending: Internal Medicine | Admitting: Internal Medicine

## 2013-07-18 VITALS — BP 108/72 | HR 75 | Temp 98.8°F | Resp 15 | Wt 123.8 lb

## 2013-07-18 DIAGNOSIS — R5381 Other malaise: Secondary | ICD-10-CM | POA: Insufficient documentation

## 2013-07-18 DIAGNOSIS — Z09 Encounter for follow-up examination after completed treatment for conditions other than malignant neoplasm: Secondary | ICD-10-CM

## 2013-07-18 DIAGNOSIS — R1032 Left lower quadrant pain: Secondary | ICD-10-CM | POA: Insufficient documentation

## 2013-07-18 DIAGNOSIS — D353 Benign neoplasm of craniopharyngeal duct: Principal | ICD-10-CM

## 2013-07-18 DIAGNOSIS — R51 Headache: Secondary | ICD-10-CM | POA: Insufficient documentation

## 2013-07-18 DIAGNOSIS — D352 Benign neoplasm of pituitary gland: Secondary | ICD-10-CM | POA: Insufficient documentation

## 2013-07-18 DIAGNOSIS — R5383 Other fatigue: Secondary | ICD-10-CM

## 2013-07-18 LAB — CBC WITH DIFFERENTIAL/PLATELET
Basophils Absolute: 0 10*3/uL (ref 0.0–0.1)
Basophils Relative: 0 % (ref 0–1)
Eosinophils Absolute: 0.2 10*3/uL (ref 0.0–0.7)
Eosinophils Relative: 2 % (ref 0–5)
HCT: 40.2 % (ref 36.0–46.0)
Hemoglobin: 13.9 g/dL (ref 12.0–15.0)
LYMPHS PCT: 24 % (ref 12–46)
Lymphs Abs: 1.8 10*3/uL (ref 0.7–4.0)
MCH: 29.7 pg (ref 26.0–34.0)
MCHC: 34.6 g/dL (ref 30.0–36.0)
MCV: 85.9 fL (ref 78.0–100.0)
MONOS PCT: 5 % (ref 3–12)
Monocytes Absolute: 0.4 10*3/uL (ref 0.1–1.0)
NEUTROS ABS: 5.2 10*3/uL (ref 1.7–7.7)
Neutrophils Relative %: 69 % (ref 43–77)
Platelets: 195 10*3/uL (ref 150–400)
RBC: 4.68 MIL/uL (ref 3.87–5.11)
RDW: 13.8 % (ref 11.5–15.5)
WBC: 7.5 10*3/uL (ref 4.0–10.5)

## 2013-07-18 NOTE — Progress Notes (Signed)
Patient is a follow up from her initial visit to establish care

## 2013-07-18 NOTE — Progress Notes (Signed)
MRN: [id number] Name: Amber Harper  Sex: female Age: 28 y.o. DOB: 27-Nov-1985  Allergies: Review of patient's allergies indicates no known allergies.  Chief Complaint  Patient presents with  . Follow-up    HPI: Patient is 28 y.o. female who comes today for followup, she is history of prolactinoma following up with endocrinologist and is on cabergoline, as per patient she also recently had a Pap smear done and reported to be normal, she is complaining of some right lower quadrant pain and felt some lump, denies any nausea vomiting any change in bowel habits. Patient also complaining of feeling tired and fatigued, complaints of chills but denies any fever. Past Medical History  Diagnosis Date  . Headache(784.0)   . Arthritis     Past Surgical History  Procedure Laterality Date  . Cesarean section    . Mass excision Left 09/14/2012    Procedure: EXCISION MASS;  Surgeon: Ruby Cola, MD;  Location: [facility] OR;  Service: ENT;  Laterality: Left;  removal of tongue mass      Medication List       This list is accurate as of: 07/18/13  5:01 PM.  Always use your most recent med list.               cabergoline 0.5 MG tablet  Commonly known as:  DOSTINEX  Take 1 tablet (0.5 mg total) by mouth 2 (two) times a week. Takes 1/2 tablet 2 times a week     SUPER B COMPLEX PO  Take by mouth.        No orders of the defined types were placed in this encounter.     There is no immunization history on file for this patient.  Family History  Problem Relation Age of Onset  . Diabetes Mother   . Diabetes Father   . Diabetes Maternal Grandmother   . Diabetes Paternal Grandmother     History  Substance Use Topics  . Smoking status: Never Smoker   . Smokeless tobacco: Never Used  . Alcohol Use: No    Review of Systems   As noted in HPI  Filed Vitals:   07/18/13 1641  BP: 108/72  Pulse: 75  Temp: 98.8 F (37.1 C)  Resp: 15    Physical Exam  Physical  Exam  Constitutional: No distress.  Eyes: EOM are normal.  Cardiovascular: Normal rate and regular rhythm.   Pulmonary/Chest: Breath sounds normal. No respiratory distress. She has no wheezes. She has no rales.  Abdominal: Soft. There is no tenderness. There is no rebound.    CBC    Component Value Date/Time   WBC 6.0 11/27/2012 1035   RBC 4.60 11/27/2012 1035   HGB 14.2 11/27/2012 1035   HCT 40.2 11/27/2012 1035   PLT 201 11/27/2012 1035   MCV 87.4 11/27/2012 1035   LYMPHSABS 1.5 11/27/2012 1035   MONOABS 0.3 11/27/2012 1035   EOSABS 0.1 11/27/2012 1035   BASOSABS 0.0 11/27/2012 1035    CMP     Component Value Date/Time   NA 140 11/27/2012 1035   K 4.4 11/27/2012 1035   CL 104 11/27/2012 1035   CO2 26 11/27/2012 1035   GLUCOSE 94 11/27/2012 1035   BUN 14 11/27/2012 1035   CREATININE 0.73 11/27/2012 1035   CREATININE 0.63 09/12/2012 1551   CALCIUM 9.7 11/27/2012 1035   PROT 7.5 11/27/2012 1035   ALBUMIN 4.8 11/27/2012 1035   AST 18 11/27/2012 1035   ALT 12 11/27/2012  1035   ALKPHOS 74 11/27/2012 1035   BILITOT 0.5 11/27/2012 1035   GFRNONAA >89 11/27/2012 1035   GFRNONAA >90 09/12/2012 1551   GFRAA >89 11/27/2012 1035   GFRAA >90 09/12/2012 1551    Lab Results  Component Value Date/Time   CHOL 151 11/27/2012 10:35 AM    No components found with this basename: hga1c    Lab Results  Component Value Date/Time   AST 18 11/27/2012 10:35 AM    Assessment and Plan  Follow up  Prolactinoma Patient to follow up with her endocrinologist and is on cabergoline.  Abdominal pain, left lower quadrant - Plan: I have ordered US Abdomen Complete  Other malaise and fatigue - Plan: Will check Vit D  25 hydroxy (rtn osteoporosis monitoring), COMPLETE METABOLIC PANEL WITH GFR, CBC with Differential   Health Maintenance  -Pap Smear: uptodate    Return in about 4 months (around 11/18/2013), or if symptoms worsen or fail to improve.  Lorayne Marek, MD

## 2013-07-19 ENCOUNTER — Telehealth: Payer: Self-pay | Admitting: *Deleted

## 2013-07-19 ENCOUNTER — Telehealth: Payer: Self-pay

## 2013-07-19 ENCOUNTER — Ambulatory Visit: Payer: Self-pay | Admitting: Internal Medicine

## 2013-07-19 DIAGNOSIS — E559 Vitamin D deficiency, unspecified: Secondary | ICD-10-CM

## 2013-07-19 LAB — COMPLETE METABOLIC PANEL WITH GFR
ALBUMIN: 4.2 g/dL (ref 3.5–5.2)
ALT: 13 U/L (ref 0–35)
AST: 15 U/L (ref 0–37)
Alkaline Phosphatase: 76 U/L (ref 39–117)
BUN: 18 mg/dL (ref 6–23)
CO2: 23 mEq/L (ref 19–32)
Calcium: 8.8 mg/dL (ref 8.4–10.5)
Chloride: 105 mEq/L (ref 96–112)
Creat: 0.79 mg/dL (ref 0.50–1.10)
GLUCOSE: 98 mg/dL (ref 70–99)
POTASSIUM: 4 meq/L (ref 3.5–5.3)
SODIUM: 137 meq/L (ref 135–145)
Total Bilirubin: 0.4 mg/dL (ref 0.2–1.2)
Total Protein: 7 g/dL (ref 6.0–8.3)

## 2013-07-19 LAB — VITAMIN D 25 HYDROXY (VIT D DEFICIENCY, FRACTURES): Vit D, 25-Hydroxy: 24 ng/mL — ABNORMAL LOW (ref 30–89)

## 2013-07-19 MED ORDER — VITAMIN D (ERGOCALCIFEROL) 1.25 MG (50000 UNIT) PO CAPS: 50000.0000 [IU] | ORAL_CAPSULE | ORAL | Status: DC

## 2013-07-19 NOTE — Telephone Encounter (Signed)
Pt is aware of her lab results and she will pick up her medication on Monday.

## 2013-07-19 NOTE — Telephone Encounter (Signed)
Message copied by Joan Mayans on Fri Jul 19, 2013  5:26 PM ------      Message from: Lorayne Marek      Created: Fri Jul 19, 2013  9:23 AM       Blood work reviewed, noticed low vitamin D, call patient advise to start ergocalciferol 50,000 units once a week for the duration of  12 weeks.       ------

## 2013-07-22 ENCOUNTER — Telehealth: Payer: Self-pay

## 2013-07-22 ENCOUNTER — Ambulatory Visit (HOSPITAL_COMMUNITY)
Admission: RE | Admit: 2013-07-22 | Discharge: 2013-07-22 | Disposition: A | Discharge status: Home / Self Care | Payer: Self-pay | Source: Ambulatory Visit | Attending: Internal Medicine | Admitting: Internal Medicine

## 2013-07-22 DIAGNOSIS — R1032 Left lower quadrant pain: Secondary | ICD-10-CM | POA: Insufficient documentation

## 2013-07-22 NOTE — Telephone Encounter (Signed)
Message copied by Dorothe Pea on Mon Jul 22, 2013 11:35 AM ------      Message from: Lorayne Marek      Created: Mon Jul 22, 2013  9:58 AM       Call and let the patient know that her abdominal ultrasound is normal. ------

## 2013-07-22 NOTE — Telephone Encounter (Signed)
Message copied by Dorothe Pea on Mon Jul 22, 2013 11:33 AM ------      Message from: Lorayne Marek      Created: Fri Jul 19, 2013  9:23 AM       Blood work reviewed, noticed low vitamin D, call patient advise to start ergocalciferol 50,000 units once a week for the duration of  12 weeks.       ------

## 2013-07-22 NOTE — Telephone Encounter (Signed)
Interpreter line used Patient is aware of her lab results 

## 2013-07-22 NOTE — Telephone Encounter (Signed)
Interpreter line used Patient is aware of her ultra sound results

## 2013-08-14 ENCOUNTER — Encounter (HOSPITAL_COMMUNITY): Payer: Self-pay

## 2013-08-21 ENCOUNTER — Other Ambulatory Visit: Payer: Self-pay | Admitting: *Deleted

## 2013-08-21 MED ORDER — CABERGOLINE 0.5 MG PO TABS: ORAL_TABLET | ORAL | Status: DC

## 2013-08-22 ENCOUNTER — Ambulatory Visit: Payer: No Typology Code available for payment source | Attending: Internal Medicine

## 2013-09-08 ENCOUNTER — Emergency Department (INDEPENDENT_AMBULATORY_CARE_PROVIDER_SITE_OTHER)
Admission: EM | Admit: 2013-09-08 | Discharge: 2013-09-08 | Disposition: A | Discharge status: Home / Self Care | Payer: Self-pay | Source: Home / Self Care | Attending: Family Medicine | Admitting: Family Medicine

## 2013-09-08 ENCOUNTER — Encounter (HOSPITAL_COMMUNITY): Payer: Self-pay | Admitting: Emergency Medicine

## 2013-09-08 DIAGNOSIS — S6010XA Contusion of unspecified finger with damage to nail, initial encounter: Secondary | ICD-10-CM

## 2013-09-08 DIAGNOSIS — S6000XA Contusion of unspecified finger without damage to nail, initial encounter: Secondary | ICD-10-CM

## 2013-09-08 NOTE — ED Provider Notes (Signed)
CSN: [id number]     Arrival date & time 09/08/13  0957 History   First MD Initiated Contact with Patient 09/08/13 1010     Chief Complaint  Patient presents with  . 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 Has a painful subungual hematoma at area of injury.  Patient is a 28 y.o. female presenting with hand injury. The history is provided by the patient.  Hand Injury   Past Medical History  Diagnosis Date  . Headache(784.0)   . Arthritis    Past Surgical History  Procedure Laterality Date  . Cesarean section    . Mass excision Left 09/14/2012    Procedure: EXCISION MASS;  Surgeon: Ruby Cola, MD;  Location: [facility] OR;  Service: ENT;  Laterality: Left;  removal of tongue mass   Family History  Problem Relation Age of Onset  . Diabetes Mother   . Diabetes Father   . Diabetes Maternal Grandmother   . Diabetes Paternal Grandmother    History  Substance Use Topics  . Smoking status: Never Smoker   . Smokeless tobacco: Never Used  . Alcohol Use: No   OB History   Grav Para Term Preterm Abortions TAB SAB Ect Mult Living                 Review of Systems  All other systems reviewed and are negative.   Allergies  Review of patient's allergies indicates no known allergies.  Home Medications   Prior to Admission medications   Medication Sig Start Date End Date Taking? Authorizing Provider  B Complex-C (SUPER B COMPLEX PO) Take by mouth.    Historical Provider, MD  cabergoline (DOSTINEX) 0.5 MG tablet 1 tablet twice a week 08/21/13   Elayne Snare, MD  Vitamin D, Ergocalciferol, (DRISDOL) 50000 UNITS CAPS capsule Take 1 capsule (50,000 Units total) by mouth every 7 (seven) days. 07/19/13   Olugbemiga E Doreene Burke, MD    BP 102/66  Pulse 71  Temp(Src) 99.2 F (37.3 C) (Oral)  Resp 14  SpO2 98% Physical Exam  Nursing note and vitals reviewed. Constitutional: She is oriented to person, place, and time. She appears well-developed and well-nourished. No distress.  HENT:  Head: Normocephalic and atraumatic.  Cardiovascular: Normal rate.   Pulmonary/Chest: Effort normal.  Musculoskeletal: Normal range of motion.  +small SUH at base of nailbed at left thumb  Neurological: She is alert and oriented to person, place, and time.  Skin: Skin is warm and dry.  Psychiatric: She has a normal mood and affect. Her behavior is normal.    ED Course  INCISION AND DRAINAGE Date/Time: 09/08/2013 10:35 AM Performed by: Griselda Miner LEE H Authorized by: Lynne Leader, S Consent: Verbal consent obtained. written consent not obtained. Risks and benefits: risks, benefits and alternatives were discussed Consent given by: patient Patient identity confirmed: verbally with patient Time out: Immediately prior to procedure a "time out" was called to verify the correct patient, procedure, equipment, support staff and site/side marked as required. Type: hematoma Body area: upper extremity Location details: left thumb Incision type: two drainage holes made with eye cautery device. Complexity: simple Drainage: bloody Drainage amount: scant Wound treatment: wound left open Patient tolerance: Patient tolerated the procedure well with no immediate complications.   (  including critical care time) Labs Review Labs Reviewed - No data to display  Imaging Review No results found.   MDM   1. Subungual hematoma of digit of hand, initial encounter    Left thumb SUH drained as above. Follow up prn. Patient advised that she may lose nail and new nail will grow out over 6-12 months.    Lutricia Feil, [place] 09/08/13 1048

## 2013-09-08 NOTE — ED Notes (Signed)
Pt  Injured  The  Thumb of  Her  l  Hand  When  It  Was  Caught  Between  2  Objets   She  Has   Subungual  Hematoma  Present      Pain      Present  inj  3  Days  ago

## 2013-09-08 NOTE — ED Provider Notes (Signed)
Medical screening examination/treatment/procedure(s) were performed by a resident physician or non-physician practitioner and as the supervising physician I was immediately available for consultation/collaboration.  Lynne Leader, MD    Gregor Hams, MD 09/08/13 [phone]

## 2013-09-08 NOTE — Discharge Instructions (Signed)
Hematoma subungueal  (Subungual Hematoma)  Un hematoma subungueal es una acumulacin de sangre que se ubica debajo de la ua de un dedo de la mano o del pie. La presin que causa la sangre debajo de la ua puede Orthoptist. CAUSAS  Un hematoma subungueal se produce cuando una lesin en un dedo de la mano o del pie hace que se rompa un vaso sanguneo debajo de la ua. La lesin puede producirse por un golpe directo, por ejemplo al atraparse un dedo con [place]. Tambin se puede producir a Proofreader de una lesin repetida tal como la presin en el pie en un zapato al correr. Un hematoma subungueal tambin se llama dedo de corredor o dedo de [place].  SNTOMAS   Piel de color azul o azul oscuro debajo de la ua.  Dolor o pinchazos en el rea lesionada. DIAGNSTICO  El mdico puede determinar si tiene un hematoma, segn los sntomas y el examen fsico. Si su mdico considera que usted podra tener un hueso roto (fractura), podr indicarle una radiografa.  TRATAMIENTO  Los hematomas generalmente desaparecen por s mismos con el tiempo. El mdico podr hacer un orificio en la ua para Secondary school teacher. El drenaje de la sangre es indoloro y generalmente proporciona un alivio significativo del dolor y de los pinchazos. La ua suele crecer de forma normal despus de este procedimiento. En algunos casos puede ser necesario extirpar la ua. Esto se hace si hay un corte debajo de la ua que requiere puntos (suturas).  INSTRUCCIONES PARA EL CUIDADO EN EL HOGAR   Aplique hielo sobre la zona lesionada.  Ponga el hielo en una bolsa plstica.  Colquese una toalla entre la piel y la bolsa de hielo.  Deje la bolsa de hielo durante 15 a 20 minutos 3 a 4 veces por da, durante los primeros 1  2 das.  Eleve el rea lesionada para ayudar a Dietitian y la hinchazn.  Si le indicaron un vendaje, selo todo el [employer] diga el mdico.  Si una parte de la ua se cae, corte el resto  delicadamente. Esto evitar que la ua se enganche en algo y cause ms dao.  Slo tome medicamentos de venta libre o recetados para Glass blower/designer, las molestias o bajar la fiebre segn las indicaciones de su mdico. SOLICITE ATENCIN MDICA DE INMEDIATO SI:   Tiene hinchazn o enrojecimiento alrededor de la ua.  Observa una secrecin de color blanco amarillento (pus) en la ua.  El dolor no se alivia con los [employer].  Tiene fiebre. ASEGRESE DE QUE:   Comprende estas instrucciones.  Controlar su enfermedad.  Solicitar ayuda de inmediato si no mejora o si empeora. Document Released: 09/29/2004 Document Revised: 03/14/2011 [facility] Patient Information 2015 [place]. This information is not intended to replace advice given to you by your health care provider. Make sure you discuss any questions you have with your health care provider.

## 2013-09-30 ENCOUNTER — Ambulatory Visit: Payer: Self-pay | Attending: Internal Medicine | Admitting: Internal Medicine

## 2013-09-30 ENCOUNTER — Encounter: Payer: Self-pay | Admitting: Internal Medicine

## 2013-09-30 VITALS — BP 107/70 | HR 62 | Temp 98.3°F | Resp 16 | Wt 123.4 lb

## 2013-09-30 DIAGNOSIS — Z23 Encounter for immunization: Secondary | ICD-10-CM | POA: Insufficient documentation

## 2013-09-30 DIAGNOSIS — K029 Dental caries, unspecified: Secondary | ICD-10-CM | POA: Insufficient documentation

## 2013-09-30 DIAGNOSIS — K0889 Other specified disorders of teeth and supporting structures: Secondary | ICD-10-CM

## 2013-09-30 DIAGNOSIS — K089 Disorder of teeth and supporting structures, unspecified: Secondary | ICD-10-CM | POA: Insufficient documentation

## 2013-09-30 MED ORDER — IBUPROFEN 600 MG PO TABS: 600.0000 mg | ORAL_TABLET | Freq: Three times a day (TID) | ORAL | Status: DC | PRN

## 2013-09-30 NOTE — Progress Notes (Signed)
MRN: [id number] Name: Amber Harper  Sex: female Age: 28 y.o. DOB: Apr 30, 1985  Allergies: Review of patient's allergies indicates no known allergies.  Chief Complaint  Patient presents with  . dental referral    HPI: Patient is 28 y.o. 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 Patient would like to have a flu shot today.  Past Medical History  Diagnosis Date  . Headache(784.0)   . Arthritis     Past Surgical History  Procedure Laterality Date  . Cesarean section    . Mass excision Left 09/14/2012    Procedure: EXCISION MASS;  Surgeon: Ruby Cola, MD;  Location: [facility] OR;  Service: ENT;  Laterality: Left;  removal of tongue mass      Medication List       This list is accurate as of: 09/30/13  9:59 AM.  Always use your most recent med list.               cabergoline 0.5 MG tablet  Commonly known as:  DOSTINEX  1 tablet twice a week     ibuprofen 600 MG tablet  Commonly known as:  ADVIL,MOTRIN  Take 1 tablet (600 mg total) by mouth every 8 (eight) hours as needed.     SUPER B COMPLEX PO  Take by mouth.     Vitamin D (Ergocalciferol) 50000 UNITS Caps capsule  Commonly known as:  DRISDOL  Take 1 capsule (50,000 Units total) by mouth every 7 (seven) days.        Meds ordered this encounter  Medications  . ibuprofen (ADVIL,MOTRIN) 600 MG tablet    Sig: Take 1 tablet (600 mg total) by mouth every 8 (eight) hours as needed.    Dispense:  30 tablet    Refill:  1    Immunization History  Administered Date(s) Administered  . Influenza,inj,Quad PF,36+ Mos 09/30/2013    Family History  Problem Relation Age of Onset  . Diabetes Mother   . Diabetes Father   . Diabetes Maternal  Grandmother   . Diabetes Paternal Grandmother     History  Substance Use Topics  . Smoking status: Never Smoker   . Smokeless tobacco: Never Used  . Alcohol Use: No    Review of Systems   As noted in HPI  Filed Vitals:   09/30/13 0910  BP: 107/70  Pulse: 62  Temp: 98.3 F (36.8 C)  Resp: 16    Physical Exam  Physical Exam  Constitutional: No distress.  HENT:  Right lower molar cavities chipped tooth   Eyes: EOM are normal. Pupils are equal, round, and reactive to light.  Cardiovascular: Normal rate.   Pulmonary/Chest: Breath sounds normal. No respiratory distress. She has no wheezes. She has no rales.    CBC    Component Value Date/Time   WBC 7.5 07/18/2013 1706   RBC 4.68 07/18/2013 1706   HGB 13.9 07/18/2013 1706   HCT 40.2 07/18/2013 1706   PLT 195 07/18/2013 1706   MCV 85.9 07/18/2013 1706   LYMPHSABS 1.8 07/18/2013 1706   MONOABS 0.4 07/18/2013 1706   EOSABS 0.2 07/18/2013 1706   BASOSABS 0.0 07/18/2013 1706    CMP     Component Value Date/Time  NA 137 07/18/2013 1706   K 4.0 07/18/2013 1706   CL 105 07/18/2013 1706   CO2 23 07/18/2013 1706   GLUCOSE 98 07/18/2013 1706   BUN 18 07/18/2013 1706   CREATININE 0.79 07/18/2013 1706   CREATININE 0.63 09/12/2012 1551   CALCIUM 8.8 07/18/2013 1706   PROT 7.0 07/18/2013 1706   ALBUMIN 4.2 07/18/2013 1706   AST 15 07/18/2013 1706   ALT 13 07/18/2013 1706   ALKPHOS 76 07/18/2013 1706   BILITOT 0.4 07/18/2013 1706   GFRNONAA >89 07/18/2013 1706   GFRNONAA >90 09/12/2012 1551   GFRAA >89 07/18/2013 1706   GFRAA >90 09/12/2012 1551    Lab Results  Component Value Date/Time   CHOL 151 11/27/2012 10:35 AM    No components found with this basename: hga1c    Lab Results  Component Value Date/Time   AST 15 07/18/2013  5:06 PM    Assessment and Plan  Pain, dental/Dental cavities - Plan: ibuprofen (ADVIL,MOTRIN) 600 MG tablet, Ambulatory referral to Dentistry   Need for prophylactic vaccination and inoculation against  influenza Flu shot given today  Health Maintenance   -Influenza vaccination  Return in about 6 months (around 03/31/2014), or if symptoms worsen or fail to improve.  Lorayne Marek, MD

## 2013-09-30 NOTE — Progress Notes (Signed)
Patient here for dental referral Broke part of her tooth on the lower right side Having some pain as well

## 2013-10-23 ENCOUNTER — Encounter (HOSPITAL_COMMUNITY): Payer: Self-pay | Admitting: Emergency Medicine

## 2013-10-23 ENCOUNTER — Emergency Department (INDEPENDENT_AMBULATORY_CARE_PROVIDER_SITE_OTHER)
Admission: EM | Admit: 2013-10-23 | Discharge: 2013-10-23 | Disposition: A | Discharge status: Home / Self Care | Payer: Self-pay | Source: Home / Self Care | Attending: Emergency Medicine | Admitting: Emergency Medicine

## 2013-10-23 DIAGNOSIS — J029 Acute pharyngitis, unspecified: Secondary | ICD-10-CM

## 2013-10-23 LAB — POCT RAPID STREP A: Streptococcus, Group A Screen (Direct): NEGATIVE

## 2013-10-23 MED ORDER — AMOXICILLIN 500 MG PO CAPS: 500.0000 mg | ORAL_CAPSULE | Freq: Two times a day (BID) | ORAL | Status: DC

## 2013-10-23 NOTE — ED Notes (Signed)
C/o ST onset Sunday Sx include odynophagia, fevers, HA, white pustules on tonsils,  Reports her children were sick prior Denies vomiting, diarrhea Alert, no signs of acute distress.

## 2013-10-23 NOTE — Discharge Instructions (Signed)
Your sore throat is either strep throat or a flu-like illness. Take amoxicillin 1 pill twice a day for 10 days. Make sure you drink plenty of fluids. You can use Chloraseptic spray, available at the drug store, to help with the sore throat. Take tylenol or ibuprofen to help with fevers and body aches.  If your fevers last past Friday, you are unable to swallow liquids, or you are getting worse, please come back.  Su dolor de garganta es o faringitis estreptoccica o una enfermedad similar a la gripe. Amber Harper 10 das. Asegrese de beber mucho lquido. Usted puede utilizar el aerosol [employer], [employer], para ayudar con el dolor de Investment banker, operational. Tomar Tylenol o ibuprofeno para ayudar con fiebre y [employer] cuerpo.  Si sus fiebres duran viernes pasado, usted no puede tragar lquidos, o que se est empeorando, por favor vuelva.

## 2013-10-23 NOTE — ED Provider Notes (Signed)
CSN: [id number]     Arrival date & time 10/23/13  0803 History   First MD Initiated Contact with Patient 10/23/13 [phone]     Chief Complaint  Patient presents with  . Sore Throat   (Consider location/radiation/quality/duration/timing/severity/associated sxs/prior Treatment) HPI She's a 28 year old woman here for evaluation of sore throat. Interpreter offered, she chose to use husband as interpreter. She reports sore throat, headaches, body aches for the last 3 days. She has pain with swallowing, but is able to tolerate sips of fluids. She denies any rhinorrhea, nasal congestion, cough, shortness of breath. She does report some mild nausea, but denies vomiting or diarrhea. No urinary complaints. She denies any sick contacts.  Past Medical History  Diagnosis Date  . Headache(784.0)   . Arthritis    Past Surgical History  Procedure Laterality Date  . Cesarean section    . Mass excision Left 09/14/2012    Procedure: EXCISION MASS;  Surgeon: Ruby Cola, MD;  Location: [facility] OR;  Service: ENT;  Laterality: Left;  removal of tongue mass   Family History  Problem Relation Age of Onset  . Diabetes Mother   . Diabetes Father   . Diabetes Maternal Grandmother   . Diabetes Paternal Grandmother    History  Substance Use Topics  . Smoking status: Never Smoker   . Smokeless tobacco: Never Used  . Alcohol Use: No   OB History   Grav Para Term Preterm Abortions TAB SAB Ect Mult Living                 Review of Systems  Constitutional: Positive for fever.  HENT: Positive for ear pain (mild) and sore throat. Negative for congestion, rhinorrhea, sinus pressure and trouble swallowing.   Respiratory: Negative for cough and shortness of breath.   Gastrointestinal: Positive for nausea. Negative for vomiting and diarrhea.  Genitourinary: Negative for difficulty urinating.  Musculoskeletal: Positive for back pain and myalgias.  Skin: Negative for rash.  Neurological: Positive for headaches.     Allergies  Review of patient's allergies indicates no known allergies.  Home Medications   Prior to Admission medications   Medication Sig Start Date End Date Taking? Authorizing Provider  cabergoline (DOSTINEX) 0.5 MG tablet 1 tablet twice a week 08/21/13  Yes Elayne Snare, MD  amoxicillin (AMOXIL) 500 MG capsule Take 1 capsule (500 mg total) by mouth 2 (two) times daily. 10/23/13   Melony Overly, MD  B Complex-C (SUPER B COMPLEX PO) Take by mouth.    Historical Provider, MD  ibuprofen (ADVIL,MOTRIN) 600 MG tablet Take 1 tablet (600 mg total) by mouth every 8 (eight) hours as needed. 09/30/13   Lorayne Marek, MD  Vitamin D, Ergocalciferol, (DRISDOL) 50000 UNITS CAPS capsule Take 1 capsule (50,000 Units total) by mouth every 7 (seven) days. 07/19/13   Tresa Garter, MD   BP 97/57  Pulse 103  Temp(Src) 100.6 F (38.1 C) (Oral)  Resp 16  SpO2 97%  LMP 10/09/2013 Physical Exam  Constitutional: She is oriented to person, place, and time. She appears well-developed and well-nourished. No distress.  HENT:  Head: Normocephalic and atraumatic.  Right Ear: Tympanic membrane and external ear normal.  Left Ear: Tympanic membrane and external ear normal.  Nose: Nose normal.  Mouth/Throat: Mucous membranes are not dry. Oropharyngeal exudate and posterior oropharyngeal erythema present. No posterior oropharyngeal edema.  No sinus tenderness.  Eyes: Conjunctivae are normal. Right eye exhibits no discharge. Left eye exhibits no discharge.  Neck: Neck supple.  Cardiovascular: Normal rate, regular rhythm and normal heart sounds.   No murmur heard. Pulmonary/Chest: Effort normal and breath sounds normal. No respiratory distress. She has no wheezes. She has no rales.  Abdominal: Soft. Bowel sounds are normal. She exhibits no distension. There is tenderness (mild diffuse). There is no rebound and no guarding.  Lymphadenopathy:    She has no cervical adenopathy.  Neurological: She is alert and  oriented to person, place, and time.  Skin: Skin is warm and dry. No rash noted.    ED Course  Procedures (including critical care time) Labs Review Labs Reviewed  POCT RAPID STREP A ([facility] URG CARE ONLY)    Imaging Review No results found.   MDM   1. Acute pharyngitis, unspecified pharyngitis type    Her rapid strep is negative, however her story is consistent with clinical strep throat. We'll go ahead and treat with amoxicillin for 10 days. Discussed importance of fluid intake. Symptomatic treatment with Chloraseptic spray, Tylenol, Motrin. Reviewed reasons to return as in after visit summary.    Melony Overly, MD 10/23/13 [phone]

## 2013-10-25 LAB — CULTURE, GROUP A STREP

## 2013-10-29 ENCOUNTER — Other Ambulatory Visit: Payer: Self-pay

## 2013-10-29 DIAGNOSIS — D352 Benign neoplasm of pituitary gland: Secondary | ICD-10-CM

## 2013-10-30 LAB — PROLACTIN: Prolactin: 4.9 ng/mL

## 2013-11-01 ENCOUNTER — Other Ambulatory Visit: Payer: Self-pay | Admitting: *Deleted

## 2013-11-01 ENCOUNTER — Ambulatory Visit (INDEPENDENT_AMBULATORY_CARE_PROVIDER_SITE_OTHER): Payer: Self-pay | Admitting: Endocrinology

## 2013-11-01 ENCOUNTER — Encounter: Payer: Self-pay | Admitting: Endocrinology

## 2013-11-01 VITALS — BP 100/62 | HR 65 | Temp 97.6°F | Resp 14 | Ht <= 58 in | Wt 120.0 lb

## 2013-11-01 DIAGNOSIS — D352 Benign neoplasm of pituitary gland: Secondary | ICD-10-CM

## 2013-11-01 MED ORDER — CABERGOLINE 0.5 MG PO TABS: ORAL_TABLET | ORAL | Status: DC

## 2013-11-01 NOTE — Progress Notes (Signed)
Amber Harper   Chief complaint: Followup of prolactinoma  History of Present Illness:   She has had amenorrhea since 4/14 after stopping her birth control pill  Also since her last childbirth she had some degree of galactorrhea, mostly on expressing milk from her breasts. She was evaluated for above symptoms and found to have an elevated prolactin level of 227 in 11/2012. 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  She was started on Dostinex on 02/12/13 and has been taking a half tablet twice a week She did have nausea and little dizziness with the full tablet previously  Menses have resumed since early April 2015 and have been regular She is tolerating the Dostinex 1/2 tablet twice a week Also did not have any galactorrhea now She  does not plan a pregnancy in the near future but may do so later  Her prolactin level is now slightly lower but still within the normal range She is compliant with her medication   Lab Results  Component Value Date   PROLACTIN 4.9 10/29/2013   PROLACTIN 10.5 06/24/2013   PROLACTIN 10.3 03/25/2013   PROLACTIN 227.2* 11/27/2012        Medication List       This list is accurate as of: 11/01/13  8:10 AM.  Always use your most recent med list.               amoxicillin 500 MG capsule  Commonly known as:  AMOXIL  Take 1 capsule (500 mg total) by mouth 2 (two) times daily.     cabergoline 0.5 MG tablet  Commonly known as:  DOSTINEX  1/2 tablet twice a week     ibuprofen 600 MG tablet  Commonly known as:  ADVIL,MOTRIN  Take 1 tablet (600 mg total) by mouth every 8 (eight) hours as needed.     SUPER B COMPLEX PO  Take by mouth.     Vitamin D (Ergocalciferol) 50000 UNITS Caps capsule  Commonly known as:  DRISDOL  Take 1 capsule (50,000 Units total) by mouth every 7 (seven) days.        Allergies: No Known Allergies  Past Medical History  Diagnosis Date  . Headache(784.0)   . Arthritis      Past Surgical History  Procedure Laterality Date  . Cesarean section    . Mass excision Left 09/14/2012    Procedure: EXCISION MASS;  Surgeon: Ruby Cola, MD;  Location: [facility] OR;  Service: ENT;  Laterality: Left;  removal of tongue mass    Family History  Problem Relation Age of Onset  . Diabetes Mother   . Diabetes Father   . Diabetes Maternal Grandmother   . Diabetes Paternal Grandmother     Social History:  reports that she has never smoked. She has never used smokeless tobacco. She reports that she does not drink alcohol or use illicit drugs.  REVIEW Of SYSTEMS:   No history of gestational diabetes  She has occasional headaches which are not new     Physical Exam BP 100/62  Pulse 65  Temp(Src) 97.6 F (36.4 C)  Resp 14  Ht 4' 8.5" (1.435 m)  Wt 120 lb (54.432 kg)  BMI 26.43 kg/m2  SpO2 97%  LMP 10/09/2013  Not indicated  Assessment/Plan:   Prolactinoma with baseline increased prolactin level of 227 and symptoms of amenorrhea, nonspecific headaches and galactorrhea  She has had no galactorrhea and also has regular  menstrual cycles with Dostinex  half tablet twice a week Prolactin is much improved at 4.9 She will continue the same dose but may plan to reduce the dose if it is lower  Have emphasized the need for prevention of pregnancy with barrier contraceptives and to avoid pregnancy until her prolactinoma has resolved and she is off the medication for 6 months as management of prolactinoma may be difficult during pregnancy  Followup in 6 months   Kolson Chovanec 11/01/2013, 8:10 AM

## 2013-11-01 NOTE — Patient Instructions (Signed)
Same dose 

## 2013-11-11 ENCOUNTER — Encounter: Payer: Self-pay | Admitting: Internal Medicine

## 2013-11-11 ENCOUNTER — Ambulatory Visit: Payer: Self-pay | Attending: Internal Medicine | Admitting: Internal Medicine

## 2013-11-11 VITALS — BP 122/78 | HR 76 | Temp 98.0°F | Resp 16 | Wt 121.4 lb

## 2013-11-11 DIAGNOSIS — G44209 Tension-type headache, unspecified, not intractable: Secondary | ICD-10-CM | POA: Insufficient documentation

## 2013-11-11 DIAGNOSIS — Z79899 Other long term (current) drug therapy: Secondary | ICD-10-CM | POA: Insufficient documentation

## 2013-11-11 DIAGNOSIS — G4700 Insomnia, unspecified: Secondary | ICD-10-CM | POA: Insufficient documentation

## 2013-11-11 MED ORDER — AMITRIPTYLINE HCL 25 MG PO TABS: 25.0000 mg | ORAL_TABLET | Freq: Every day | ORAL | Status: DC

## 2013-11-11 NOTE — Progress Notes (Signed)
Patient here for follow up Patient states she has been having some headaches and Lately has not been sleeping very well Still waiting to see the dentist

## 2013-11-11 NOTE — Progress Notes (Signed)
MRN: [id number] Name: Amber Harper  Sex: female Age: 28 y.o. DOB: 07/27/1985  Allergies: Review of patient's allergies indicates no known allergies.  Chief Complaint  Patient presents with  . Follow-up    HPI: Patient is 28 y.o. female who Has history of prolactinoma currently following up with endocrinologist and is on cabergoline and her prolactin level is in normal range, comes today for followup as per patient she has been experiencing some headache which goes all the way  from front to the back to the neck as per patient she has been under a lot of stress and also it affects her sleep, denies any SI or HI.patient denies any fever chills chest pain shortness of breath numbness or weakness.  Past Medical History  Diagnosis Date  . Headache(784.0)   . Arthritis     Past Surgical History  Procedure Laterality Date  . Cesarean section    . Mass excision Left 09/14/2012    Procedure: EXCISION MASS;  Surgeon: Ruby Cola, MD;  Location: [facility] OR;  Service: ENT;  Laterality: Left;  removal of tongue mass      Medication List       This list is accurate as of: 11/11/13  5:33 PM.  Always use your most recent med list.               amitriptyline 25 MG tablet  Commonly known as:  ELAVIL  Take 1 tablet (25 mg total) by mouth at bedtime.     cabergoline 0.5 MG tablet  Commonly known as:  DOSTINEX  1/2 tablet twice a week     ibuprofen 600 MG tablet  Commonly known as:  ADVIL,MOTRIN  Take 1 tablet (600 mg total) by mouth every 8 (eight) hours as needed.     SUPER B COMPLEX PO  Take by mouth.        Meds ordered this encounter  Medications  . amitriptyline (ELAVIL) 25 MG tablet    Sig: Take 1 tablet (25 mg total) by mouth at bedtime.    Dispense:  30 tablet    Refill:  3    Immunization History  Administered Date(s) Administered  . Influenza,inj,Quad PF,36+ Mos 09/30/2013    Family History  Problem Relation Age of Onset  . Diabetes Mother     . Diabetes Father   . Diabetes Maternal Grandmother   . Diabetes Paternal Grandmother     History  Substance Use Topics  . Smoking status: Never Smoker   . Smokeless tobacco: Never Used  . Alcohol Use: No    Review of Systems   As noted in HPI  Filed Vitals:   11/11/13 1707  BP: 122/78  Pulse: 76  Temp: 98 F (36.7 C)  Resp: 16    Physical Exam  Physical Exam  Constitutional: She is oriented to person, place, and time. No distress.  Eyes: EOM are normal. Pupils are equal, round, and reactive to light.  Cardiovascular: Normal rate and regular rhythm.   Pulmonary/Chest: Breath sounds normal. No respiratory distress. She has no wheezes. She has no rales.  Neurological: She is alert and oriented to person, place, and time. She has normal reflexes.    CBC    Component Value Date/Time   WBC 7.5 07/18/2013 1706   RBC 4.68 07/18/2013 1706   HGB 13.9 07/18/2013 1706   HCT 40.2 07/18/2013 1706   PLT 195 07/18/2013 1706   MCV 85.9 07/18/2013 1706   LYMPHSABS 1.8 07/18/2013 1706  MONOABS 0.4 07/18/2013 1706   EOSABS 0.2 07/18/2013 1706   BASOSABS 0.0 07/18/2013 1706    CMP     Component Value Date/Time   NA 137 07/18/2013 1706   K 4.0 07/18/2013 1706   CL 105 07/18/2013 1706   CO2 23 07/18/2013 1706   GLUCOSE 98 07/18/2013 1706   BUN 18 07/18/2013 1706   CREATININE 0.79 07/18/2013 1706   CREATININE 0.63 09/12/2012 1551   CALCIUM 8.8 07/18/2013 1706   PROT 7.0 07/18/2013 1706   ALBUMIN 4.2 07/18/2013 1706   AST 15 07/18/2013 1706   ALT 13 07/18/2013 1706   ALKPHOS 76 07/18/2013 1706   BILITOT 0.4 07/18/2013 1706   GFRNONAA >89 07/18/2013 1706   GFRNONAA >90 09/12/2012 1551   GFRAA >89 07/18/2013 1706   GFRAA >90 09/12/2012 1551    Lab Results  Component Value Date/Time   CHOL 151 11/27/2012 10:35 AM    No components found for: HGA1C  Lab Results  Component Value Date/Time   AST 15 07/18/2013 05:06 PM    Assessment and Plan  Tension  headache/Insomnia  - Plan: trial of amitriptyline (ELAVIL) 25 MG tablet  Health Maintenance uptodate with the flu shot   Return in about 3 months (around 02/11/2014).  Lorayne Marek, MD

## 2013-12-20 ENCOUNTER — Encounter: Payer: Self-pay | Admitting: Internal Medicine

## 2013-12-20 ENCOUNTER — Ambulatory Visit (HOSPITAL_COMMUNITY)
Admission: RE | Admit: 2013-12-20 | Discharge: 2013-12-20 | Disposition: A | Discharge status: Home / Self Care | Payer: Self-pay | Source: Ambulatory Visit | Attending: Internal Medicine | Admitting: Internal Medicine

## 2013-12-20 ENCOUNTER — Ambulatory Visit: Payer: Self-pay | Attending: Internal Medicine | Admitting: Internal Medicine

## 2013-12-20 VITALS — BP 103/68 | HR 67 | Temp 98.3°F | Resp 16 | Ht <= 58 in | Wt 122.0 lb

## 2013-12-20 DIAGNOSIS — R0789 Other chest pain: Secondary | ICD-10-CM

## 2013-12-20 DIAGNOSIS — R0781 Pleurodynia: Secondary | ICD-10-CM | POA: Insufficient documentation

## 2013-12-20 DIAGNOSIS — Z79899 Other long term (current) drug therapy: Secondary | ICD-10-CM | POA: Insufficient documentation

## 2013-12-20 DIAGNOSIS — R51 Headache: Secondary | ICD-10-CM | POA: Insufficient documentation

## 2013-12-20 DIAGNOSIS — M1990 Unspecified osteoarthritis, unspecified site: Secondary | ICD-10-CM | POA: Insufficient documentation

## 2013-12-20 MED ORDER — CYCLOBENZAPRINE HCL 10 MG PO TABS: 10.0000 mg | ORAL_TABLET | Freq: Every day | ORAL | Status: DC

## 2013-12-20 NOTE — Progress Notes (Signed)
Pt is here today b/c she is having pain under her left breast that radiates to her left arm. Pt has an interpreter.

## 2013-12-20 NOTE — Progress Notes (Signed)
MRN: [id number] Name: Amber Harper  Sex: female Age: 28 y.o. DOB: 01/03/1986  Allergies: Review of patient's allergies indicates no known allergies.  Chief Complaint  Patient presents with  . Follow-up    HPI: Patient is 28 y.o. female who comes today reported to have some left-sided chest pain which he felt almost one week ago, as per patient she took some ibuprofen which helped her with the symptoms her, she denies any fall or trauma still has on and off as per patient is worse when she is resting, in the daytime when she is physically active it  doesn't bother her, she also reported to have left arm  pain but again that is improved denies any fever chills orthopnea PND numbness weakness.  Past Medical History  Diagnosis Date  . Headache(784.0)   . Arthritis     Past Surgical History  Procedure Laterality Date  . Cesarean section    . Mass excision Left 09/14/2012    Procedure: EXCISION MASS;  Surgeon: Ruby Cola, MD;  Location: [facility] OR;  Service: ENT;  Laterality: Left;  removal of tongue mass      Medication List       This list is accurate as of: 12/20/13 11:16 AM.  Always use your most recent med list.               amitriptyline 25 MG tablet  Commonly known as:  ELAVIL  Take 1 tablet (25 mg total) by mouth at bedtime.     cabergoline 0.5 MG tablet  Commonly known as:  DOSTINEX  1/2 tablet twice a week     cyclobenzaprine 10 MG tablet  Commonly known as:  FLEXERIL  Take 1 tablet (10 mg total) by mouth at bedtime.     ibuprofen 600 MG tablet  Commonly known as:  ADVIL,MOTRIN  Take 1 tablet (600 mg total) by mouth every 8 (eight) hours as needed.     SUPER B COMPLEX PO  Take by mouth.        Meds ordered this encounter  Medications  . cyclobenzaprine (FLEXERIL) 10 MG tablet    Sig: Take 1 tablet (10 mg total) by mouth at bedtime.    Dispense:  30 tablet    Refill:  0    Immunization History  Administered Date(s) Administered    . Influenza,inj,Quad PF,36+ Mos 09/30/2013    Family History  Problem Relation Age of Onset  . Diabetes Mother   . Diabetes Father   . Diabetes Maternal Grandmother   . Diabetes Paternal Grandmother     History  Substance Use Topics  . Smoking status: Never Smoker   . Smokeless tobacco: Never Used  . Alcohol Use: No    Review of Systems   As noted in HPI  Filed Vitals:   12/20/13 1040  BP: 103/68  Pulse: 67  Temp: 98.3 F (36.8 C)  Resp: 16    Physical Exam  Physical Exam  Cardiovascular: Normal rate and regular rhythm.   Pulmonary/Chest: Breath sounds normal. No respiratory distress. She has no wheezes. She has no rales.  Left sided chest wall tenderness ? Muscular/ rib pain   Musculoskeletal:  Left shoulder good ROM equal strength both upper extremities     CBC    Component Value Date/Time   WBC 7.5 07/18/2013 1706   RBC 4.68 07/18/2013 1706   HGB 13.9 07/18/2013 1706   HCT 40.2 07/18/2013 1706   PLT 195 07/18/2013 1706  MCV 85.9 07/18/2013 1706   LYMPHSABS 1.8 07/18/2013 1706   MONOABS 0.4 07/18/2013 1706   EOSABS 0.2 07/18/2013 1706   BASOSABS 0.0 07/18/2013 1706    CMP     Component Value Date/Time   NA 137 07/18/2013 1706   K 4.0 07/18/2013 1706   CL 105 07/18/2013 1706   CO2 23 07/18/2013 1706   GLUCOSE 98 07/18/2013 1706   BUN 18 07/18/2013 1706   CREATININE 0.79 07/18/2013 1706   CREATININE 0.63 09/12/2012 1551   CALCIUM 8.8 07/18/2013 1706   PROT 7.0 07/18/2013 1706   ALBUMIN 4.2 07/18/2013 1706   AST 15 07/18/2013 1706   ALT 13 07/18/2013 1706   ALKPHOS 76 07/18/2013 1706   BILITOT 0.4 07/18/2013 1706   GFRNONAA >89 07/18/2013 1706   GFRNONAA >90 09/12/2012 1551   GFRAA >89 07/18/2013 1706   GFRAA >90 09/12/2012 1551    Lab Results  Component Value Date/Time   CHOL 151 11/27/2012 10:35 AM    No components found for: HGA1C  Lab Results  Component Value Date/Time   AST 15 07/18/2013 05:06 PM    Assessment and  Plan  Rib pain on left side - Plan:ordered DG Ribs Unilateral Left  Chest pain, muscular - Plan: cyclobenzaprine (FLEXERIL) 10 MG tablet   Health Maintenance  -Vaccinations:  uptodate with flu shot   Return in about 3 months (around 03/21/2014), or if symptoms worsen or fail to improve.  Lorayne Marek, MD

## 2013-12-24 ENCOUNTER — Telehealth: Payer: Self-pay | Admitting: Emergency Medicine

## 2013-12-24 NOTE — Telephone Encounter (Signed)
Pt aware of lab results 

## 2013-12-24 NOTE — Telephone Encounter (Signed)
-----   Message from Lorayne Marek, MD sent at 12/20/2013  3:34 PM EST ----- Call and let the patient know that xray is negative

## 2014-04-09 ENCOUNTER — Ambulatory Visit: Payer: Self-pay | Attending: Internal Medicine

## 2014-04-29 ENCOUNTER — Other Ambulatory Visit: Payer: Self-pay

## 2014-04-29 DIAGNOSIS — D352 Benign neoplasm of pituitary gland: Secondary | ICD-10-CM

## 2014-04-30 LAB — PROLACTIN: Prolactin: 4.7 ng/mL

## 2014-05-02 ENCOUNTER — Encounter: Payer: Self-pay | Admitting: Endocrinology

## 2014-05-02 ENCOUNTER — Ambulatory Visit (INDEPENDENT_AMBULATORY_CARE_PROVIDER_SITE_OTHER): Payer: Self-pay | Admitting: Endocrinology

## 2014-05-02 VITALS — BP 104/62 | HR 84 | Temp 97.6°F | Resp 14 | Ht <= 58 in

## 2014-05-02 DIAGNOSIS — D352 Benign neoplasm of pituitary gland: Secondary | ICD-10-CM

## 2014-05-02 NOTE — Progress Notes (Signed)
Amber Harper 29 y.o.             Chief complaint: Followup of prolactinoma  History of Present Illness:   Amber Harper has had amenorrhea since 4/14 after stopping Amber Harper birth control pill  Also since Amber Harper last childbirth Amber Harper had some degree of galactorrhea, mostly on expressing milk from Amber Harper breasts. Amber Harper was evaluated for above symptoms and found to have an elevated prolactin level of 227 in 11/2012. MRI of Amber Harper pituitary gland showed a 6 mm left-sided pituitary microadenoma  Amber Harper was started on Dostinex on 02/12/13 and has been taking a half tablet twice a week Amber Harper did have nausea and little dizziness with the full tablet previously No side effects from this currently  Menses have resumed since early April 2015 and have been regular Also did not have any galactorrhea now  Amber Harper is taking the Dostinex 0.5mg  1/2 tablet twice a week on Mondays and Thursdays and is compliant with this Amber Harper  does not plan a pregnancy in the near future but is asking about it  Amber Harper prolactin level is now stable in the low normal range   Lab Results  Component Value Date   PROLACTIN 4.7 04/29/2014   PROLACTIN 4.9 10/29/2013   PROLACTIN 10.5 06/24/2013   PROLACTIN 10.3 03/25/2013        Medication List       This list is accurate as of: 05/02/14 10:07 AM.  Always use your most recent med list.               amitriptyline 25 MG tablet  Commonly known as:  ELAVIL  Take 1 tablet (25 mg total) by mouth at bedtime.     cabergoline 0.5 MG tablet  Commonly known as:  DOSTINEX  1/2 tablet twice a week     SUPER B COMPLEX PO  Take by mouth.        Allergies: No Known Allergies  Past Medical History  Diagnosis Date  . Headache(784.0)   . Arthritis     Past Surgical History  Procedure Laterality Date  . Cesarean section    . Mass excision Left 09/14/2012    Procedure: EXCISION MASS;  Surgeon: Ruby Cola, MD;  Location: [facility] OR;  Service: ENT;  Laterality: Left;  removal of tongue  mass    Family History  Problem Relation Age of Onset  . Diabetes Mother   . Diabetes Father   . Diabetes Maternal Grandmother   . Diabetes Paternal Grandmother     Social History:  reports that Amber Harper has never smoked. Amber Harper has never used smokeless tobacco. Amber Harper reports that Amber Harper does not drink alcohol or use illicit drugs.  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 and symptoms of amenorrhea, nonspecific headaches and galactorrhea  Amber Harper has had no galactorrhea and also has regular  menstrual cycles with Dostinex half tablet twice a week Prolactin is excellent now and stable at 4.7 Explained to patient that since prolactin level is quite normal with did not need to repeat MRI for Amber Harper microadenoma  Amber Harper will try reducing the dose to once a week now to see if Amber Harper can be tapered off Amber Harper was advised to avoid pregnancy until  at least next year and hopefully we can taper off Amber Harper medication completely  Followup in 3 months with repeat prolactin level   Amber Harper 05/02/2014, 10:07 AM

## 2014-05-02 NOTE — Patient Instructions (Signed)
Take dostinex ONCE a week

## 2014-06-12 ENCOUNTER — Ambulatory Visit: Payer: Self-pay | Attending: Internal Medicine | Admitting: Internal Medicine

## 2014-06-12 ENCOUNTER — Encounter: Payer: Self-pay | Admitting: Internal Medicine

## 2014-06-12 VITALS — BP 123/74 | HR 86 | Temp 98.0°F | Resp 16 | Wt 122.0 lb

## 2014-06-12 DIAGNOSIS — R319 Hematuria, unspecified: Secondary | ICD-10-CM

## 2014-06-12 DIAGNOSIS — R109 Unspecified abdominal pain: Secondary | ICD-10-CM

## 2014-06-12 DIAGNOSIS — R5383 Other fatigue: Secondary | ICD-10-CM

## 2014-06-12 LAB — POCT URINALYSIS DIPSTICK
Bilirubin, UA: NEGATIVE
Glucose, UA: NEGATIVE
Ketones, UA: NEGATIVE
Leukocytes, UA: NEGATIVE
NITRITE UA: NEGATIVE
Protein, UA: NEGATIVE
Spec Grav, UA: 1.025
UROBILINOGEN UA: 0.2
pH, UA: 6.5

## 2014-06-12 LAB — CBC WITH DIFFERENTIAL/PLATELET
BASOS ABS: 0 10*3/uL (ref 0.0–0.1)
Basophils Relative: 0 % (ref 0–1)
Eosinophils Absolute: 0.1 10*3/uL (ref 0.0–0.7)
Eosinophils Relative: 1 % (ref 0–5)
HCT: 42 % (ref 36.0–46.0)
Hemoglobin: 14.3 g/dL (ref 12.0–15.0)
Lymphocytes Relative: 22 % (ref 12–46)
Lymphs Abs: 1.7 10*3/uL (ref 0.7–4.0)
MCH: 30.2 pg (ref 26.0–34.0)
MCHC: 34 g/dL (ref 30.0–36.0)
MCV: 88.6 fL (ref 78.0–100.0)
MONO ABS: 0.4 10*3/uL (ref 0.1–1.0)
MPV: 11.2 fL (ref 8.6–12.4)
Monocytes Relative: 5 % (ref 3–12)
Neutro Abs: 5.5 10*3/uL (ref 1.7–7.7)
Neutrophils Relative %: 72 % (ref 43–77)
Platelets: 198 10*3/uL (ref 150–400)
RBC: 4.74 MIL/uL (ref 3.87–5.11)
RDW: 13.4 % (ref 11.5–15.5)
WBC: 7.7 10*3/uL (ref 4.0–10.5)

## 2014-06-12 LAB — COMPLETE METABOLIC PANEL WITH GFR
ALK PHOS: 70 U/L (ref 39–117)
ALT: 14 U/L (ref 0–35)
AST: 17 U/L (ref 0–37)
Albumin: 4.3 g/dL (ref 3.5–5.2)
BILIRUBIN TOTAL: 0.4 mg/dL (ref 0.2–1.2)
BUN: 14 mg/dL (ref 6–23)
CHLORIDE: 102 meq/L (ref 96–112)
CO2: 22 meq/L (ref 19–32)
Calcium: 9.8 mg/dL (ref 8.4–10.5)
Creat: 0.68 mg/dL (ref 0.50–1.10)
GFR, Est Non African American: >89 mL/min
Glucose, Bld: 110 mg/dL — ABNORMAL HIGH (ref 70–99)
Potassium: 4.2 mEq/L (ref 3.5–5.3)
Sodium: 133 mEq/L — ABNORMAL LOW (ref 135–145)
Total Protein: 7.4 g/dL (ref 6.0–8.3)

## 2014-06-12 LAB — TSH: TSH: 2.257 u[IU]/mL (ref 0.350–4.500)

## 2014-06-12 LAB — POCT URINE PREGNANCY: Preg Test, Ur: NEGATIVE

## 2014-06-12 NOTE — Progress Notes (Signed)
MRN: [id number] Name: Amber Harper  Sex: female Age: 29 y.o. DOB: 18-Nov-1985  Allergies: Review of patient's allergies indicates no known allergies.  Chief Complaint  Patient presents with  . Abdominal Pain    HPI: Patient is 29 y.o. female who has history of prolactinoma, currently following up with her endocrinologist, she comes today complaining of right flank pain on and off for the last one to 2 months, the pain was severe one week ago which lasted for several hours she does complain of nausea denies any vomiting denies any change in bowel habits denies any urinary symptoms, her last menstrual period was 3 weeks ago, the pain she describes is more deep non radiating.patient is also complaining of feeling tired fatigued denies any fever chills.  Past Medical History  Diagnosis Date  . Headache(784.0)   . Arthritis     Past Surgical History  Procedure Laterality Date  . Cesarean section    . Mass excision Left 09/14/2012    Procedure: EXCISION MASS;  Surgeon: Ruby Cola, MD;  Location: [facility] OR;  Service: ENT;  Laterality: Left;  removal of tongue mass      Medication List       This list is accurate as of: 06/12/14 11:12 AM.  Always use your most recent med list.               amitriptyline 25 MG tablet  Commonly known as:  ELAVIL  Take 1 tablet (25 mg total) by mouth at bedtime.     cabergoline 0.5 MG tablet  Commonly known as:  DOSTINEX  1/2 tablet twice a week     SUPER B COMPLEX PO  Take by mouth.        No orders of the defined types were placed in this encounter.    Immunization History  Administered Date(s) Administered  . Influenza,inj,Quad PF,36+ Mos 09/30/2013    Family History  Problem Relation Age of Onset  . Diabetes Mother   . Diabetes Father   . Diabetes Maternal Grandmother   . Diabetes Paternal Grandmother     History  Substance Use Topics  . Smoking status: Never Smoker   . Smokeless tobacco: Never Used  .  Alcohol Use: No    Review of Systems   As noted in HPI  Filed Vitals:   06/12/14 1032  BP: 123/74  Pulse: 86  Temp: 98 F (36.7 C)  Resp: 16    Physical Exam  Physical Exam  Constitutional: No distress.  Eyes: EOM are normal. Pupils are equal, round, and reactive to light.  Cardiovascular: Normal rate and regular rhythm.   Pulmonary/Chest: Breath sounds normal. No respiratory distress. She has no wheezes. She has no rales.  Abdominal:  Right flank tenderness with deep palpation, no rebound or guarding, no CVA tenderness, bowel sounds positive  Musculoskeletal: She exhibits no edema.    CBC    Component Value Date/Time   WBC 7.5 07/18/2013 1706   RBC 4.68 07/18/2013 1706   HGB 13.9 07/18/2013 1706   HCT 40.2 07/18/2013 1706   PLT 195 07/18/2013 1706   MCV 85.9 07/18/2013 1706   LYMPHSABS 1.8 07/18/2013 1706   MONOABS 0.4 07/18/2013 1706   EOSABS 0.2 07/18/2013 1706   BASOSABS 0.0 07/18/2013 1706    CMP     Component Value Date/Time   NA 137 07/18/2013 1706   K 4.0 07/18/2013 1706   CL 105 07/18/2013 1706   CO2 23 07/18/2013 1706  GLUCOSE 98 07/18/2013 1706   BUN 18 07/18/2013 1706   CREATININE 0.79 07/18/2013 1706   CREATININE 0.63 09/12/2012 1551   CALCIUM 8.8 07/18/2013 1706   PROT 7.0 07/18/2013 1706   ALBUMIN 4.2 07/18/2013 1706   AST 15 07/18/2013 1706   ALT 13 07/18/2013 1706   ALKPHOS 76 07/18/2013 1706   BILITOT 0.4 07/18/2013 1706   GFRNONAA >89 07/18/2013 1706   GFRNONAA >90 09/12/2012 1551   GFRAA >89 07/18/2013 1706   GFRAA >90 09/12/2012 1551    Lab Results  Component Value Date/Time   CHOL 151 11/27/2012 10:35 AM    No results found for: HGBA1C  Lab Results  Component Value Date/Time   AST 15 07/18/2013 05:06 PM    Assessment and Plan  Flank pain - Plan:  Results for orders placed or performed in visit on 06/12/14  Urinalysis Dipstick  Result Value Ref Range   Color, UA yellow    Clarity, UA clear    Glucose, UA neg     Bilirubin, UA neg    Ketones, UA neg    Spec Grav, UA 1.025    Blood, UA large    pH, UA 6.5    Protein, UA neg    Urobilinogen, UA 0.2    Nitrite, UA neg    Leukocytes, UA Negative   POCT urine pregnancy  Result Value Ref Range   Preg Test, Ur Negative    Urinalysis Dipstick shows positive blood, will send her urine for analysis, POCT urine pregnancy, CT Abdomen Pelvis Wo Contrast  Other fatigue - Plan: COMPLETE METABOLIC PANEL WITH GFR, CBC with Differential/Platelet, TSH, Vit D  25 hydroxy (rtn osteoporosis monitoring), Hemoglobin A1c  Hematuria - Plan: CT Abdomen Pelvis Wo Contrast, Urinalysis, Routine w reflex microscopic (not at [facility])   Return in about 3 months (around 09/12/2014), or if symptoms worsen or fail to improve.   This note has been created with Surveyor, quantity. Any transcriptional errors are unintentional.    Lorayne Marek, MD

## 2014-06-12 NOTE — Progress Notes (Signed)
Patient here with in house interpreter Complains of upper right sided abd pain that started about a month ago Patient states that last week the pain got progressively worse

## 2014-06-13 LAB — URINALYSIS, ROUTINE W REFLEX MICROSCOPIC
Bilirubin Urine: NEGATIVE
Glucose, UA: NEGATIVE mg/dL
KETONES UR: NEGATIVE mg/dL
Leukocytes, UA: NEGATIVE
Nitrite: NEGATIVE
PROTEIN: NEGATIVE mg/dL
SPECIFIC GRAVITY, URINE: 1.022 (ref 1.005–1.030)
Urobilinogen, UA: 0.2 mg/dL (ref 0.0–1.0)
pH: 6.5 (ref 5.0–8.0)

## 2014-06-13 LAB — VITAMIN D 25 HYDROXY (VIT D DEFICIENCY, FRACTURES): Vit D, 25-Hydroxy: 14 ng/mL — ABNORMAL LOW (ref 30–100)

## 2014-06-13 LAB — URINALYSIS, MICROSCOPIC ONLY
Bacteria, UA: NONE SEEN
CASTS: NONE SEEN
Crystals: NONE SEEN

## 2014-06-13 LAB — HEMOGLOBIN A1C
HEMOGLOBIN A1C: 5.5 % (ref <5.7)
Mean Plasma Glucose: 111 mg/dL (ref <117)

## 2014-06-16 ENCOUNTER — Telehealth: Payer: Self-pay

## 2014-06-16 MED ORDER — VITAMIN D (ERGOCALCIFEROL) 1.25 MG (50000 UNIT) PO CAPS: 50000.0000 [IU] | ORAL_CAPSULE | ORAL | Status: DC

## 2014-06-16 NOTE — Telephone Encounter (Signed)
Interpreter line used Patient not available Message left on machine to return our call 

## 2014-06-16 NOTE — Telephone Encounter (Signed)
-----   Message from Lorayne Marek, MD sent at 06/13/2014 10:35 AM EDT ----- Blood work reviewed, noticed low vitamin D, call patient advise to start ergocalciferol 50,000 units once a week for the duration of  12 weeks, then take OTC vitamin d 2000 units daily.

## 2014-06-19 ENCOUNTER — Telehealth: Payer: Self-pay

## 2014-06-19 ENCOUNTER — Ambulatory Visit (HOSPITAL_COMMUNITY)
Admission: RE | Admit: 2014-06-19 | Discharge: 2014-06-19 | Disposition: A | Discharge status: Home / Self Care | Payer: Self-pay | Source: Ambulatory Visit | Attending: Internal Medicine | Admitting: Internal Medicine

## 2014-06-19 DIAGNOSIS — R319 Hematuria, unspecified: Secondary | ICD-10-CM | POA: Insufficient documentation

## 2014-06-19 DIAGNOSIS — R1031 Right lower quadrant pain: Secondary | ICD-10-CM | POA: Insufficient documentation

## 2014-06-19 DIAGNOSIS — R109 Unspecified abdominal pain: Secondary | ICD-10-CM

## 2014-06-19 NOTE — Telephone Encounter (Signed)
In house interpreter used Patient is aware of her CT results

## 2014-06-19 NOTE — Telephone Encounter (Signed)
-----   Message from Lorayne Marek, MD sent at 06/19/2014  1:53 PM EDT ----- Call and let the patient know that her CT abdomen is reported to be normal.  IMPRESSION: No renal, ureteral, or bladder calculi. No hydronephrosis.  No evidence of bowel obstruction. Normal appendix.  No CT findings to account for the patient's right lower quadrant/flank pain.

## 2014-07-29 ENCOUNTER — Other Ambulatory Visit: Payer: Self-pay

## 2014-07-29 DIAGNOSIS — D352 Benign neoplasm of pituitary gland: Secondary | ICD-10-CM

## 2014-07-30 LAB — PROLACTIN: Prolactin: 19 ng/mL (ref 4.8–23.3)

## 2014-08-01 ENCOUNTER — Ambulatory Visit (INDEPENDENT_AMBULATORY_CARE_PROVIDER_SITE_OTHER): Payer: Self-pay | Admitting: Endocrinology

## 2014-08-01 ENCOUNTER — Encounter: Payer: Self-pay | Admitting: Endocrinology

## 2014-08-01 VITALS — BP 84/58 | HR 64 | Temp 97.6°F | Resp 16 | Ht <= 58 in | Wt 123.8 lb

## 2014-08-01 DIAGNOSIS — D352 Benign neoplasm of pituitary gland: Secondary | ICD-10-CM

## 2014-08-01 NOTE — Patient Instructions (Signed)
Stay on 1/2 tab weekly Do not plan pregnancy as yet  Permanezca en medio pestaa semanal No planificar el embarazo hasta el momento

## 2014-08-01 NOTE — Progress Notes (Signed)
Amber Harper 29 y.o.             Chief complaint: Followup of prolactinoma  History of Present Illness:   She has had amenorrhea since 4/14 after stopping her birth control pill  Also since her last childbirth she had some degree of galactorrhea, mostly on expressing milk from her breasts. She was evaluated for above symptoms and found to have an elevated prolactin level of 227 in 11/2012. 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  She was started on Dostinex on 02/12/13 and had been taking a half tablet twice a week She did have nausea and little dizziness with the full tablet previously  Menses have been regular since early April 2015 and currently coming every 26 days Also does not have any galactorrhea now Since her prolactin level had been low normal her dose was reduced to half tablet weekly on the Dostinex 0.5 mg in 04/2014  She  does not plan a pregnancy in the near future but is again asking about it  Her prolactin level is now stable in the upper normal range   Lab Results  Component Value Date   PROLACTIN 19.0 07/29/2014   PROLACTIN 4.7 04/29/2014   PROLACTIN 4.9 10/29/2013   PROLACTIN 10.5 06/24/2013        Medication List       This list is accurate as of: 08/01/14  9:46 AM.  Always use your most recent med list.               cabergoline 0.5 MG tablet  Commonly known as:  DOSTINEX  1/2 tablet twice a week     SUPER B COMPLEX PO  Take by mouth.        Allergies: No Known Allergies  Past Medical History  Diagnosis Date  . Headache(784.0)   . Arthritis     Past Surgical History  Procedure Laterality Date  . Cesarean section    . Mass excision Left 09/14/2012    Procedure: EXCISION MASS;  Surgeon: Ruby Cola, MD;  Location: [facility] OR;  Service: ENT;  Laterality: Left;  removal of tongue mass    Family History  Problem Relation Age of Onset  . Diabetes Mother   . Diabetes Father   . Diabetes  Maternal Grandmother   . Diabetes Paternal Grandmother     Social History:  reports that she has never smoked. She has never used smokeless tobacco. She reports that she does not drink alcohol or use illicit drugs.  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

## 2014-08-11 ENCOUNTER — Emergency Department (INDEPENDENT_AMBULATORY_CARE_PROVIDER_SITE_OTHER)
Admission: EM | Admit: 2014-08-11 | Discharge: 2014-08-11 | Disposition: A | Discharge status: Home / Self Care | Payer: Self-pay | Source: Home / Self Care | Attending: Family Medicine | Admitting: Family Medicine

## 2014-08-11 ENCOUNTER — Encounter (HOSPITAL_COMMUNITY): Payer: Self-pay | Admitting: Emergency Medicine

## 2014-08-11 ENCOUNTER — Emergency Department (INDEPENDENT_AMBULATORY_CARE_PROVIDER_SITE_OTHER): Payer: Self-pay

## 2014-08-11 DIAGNOSIS — R141 Gas pain: Secondary | ICD-10-CM

## 2014-08-11 DIAGNOSIS — J029 Acute pharyngitis, unspecified: Secondary | ICD-10-CM

## 2014-08-11 DIAGNOSIS — K5901 Slow transit constipation: Secondary | ICD-10-CM

## 2014-08-11 DIAGNOSIS — R1013 Epigastric pain: Secondary | ICD-10-CM

## 2014-08-11 DIAGNOSIS — H6982 Other specified disorders of Eustachian tube, left ear: Secondary | ICD-10-CM

## 2014-08-11 DIAGNOSIS — J301 Allergic rhinitis due to pollen: Secondary | ICD-10-CM

## 2014-08-11 LAB — POCT URINALYSIS DIP (DEVICE)
Bilirubin Urine: NEGATIVE
GLUCOSE, UA: NEGATIVE mg/dL
Ketones, ur: NEGATIVE mg/dL
Leukocytes, UA: NEGATIVE
Nitrite: NEGATIVE
Protein, ur: NEGATIVE mg/dL
Urobilinogen, UA: 0.2 mg/dL (ref 0.0–1.0)
pH: 5.5 (ref 5.0–8.0)

## 2014-08-11 LAB — POCT RAPID STREP A: Streptococcus, Group A Screen (Direct): NEGATIVE

## 2014-08-11 LAB — POCT PREGNANCY, URINE: PREG TEST UR: NEGATIVE

## 2014-08-11 NOTE — ED Notes (Signed)
C/o ST onset 5 days associated w/cough and odynophagia... Has been taking left amoxicillin  Also c/o abd pain onset yest associated w/fevers, chills Denies v/d... Alert, no signs of acute distress.

## 2014-08-11 NOTE — Discharge Instructions (Signed)
Rinitis alrgica (Allergic Rhinitis) Allegra daily for allergies Flonase nasal spray and lots of fluids La rinitis alrgica ocurre cuando las [place] mucosas de la nariz responden a los alrgenos. Los alrgenos son las partculas que estn en el aire y que hacen que el cuerpo tenga una reaccin IT consultant. Esto hace que usted libere anticuerpos alrgicos. A travs de una cadena de eventos, estos finalmente hacen que usted libere histamina en la corriente sangunea. Aunque la funcin de la histamina es proteger al organismo, es esta liberacin de histamina lo que provoca malestar, como los estornudos frecuentes, la congestin y goteo y Engineer, petroleum.  CAUSAS  La causa de la rinitis Regulatory affairs officer (fiebre del heno) son los alrgenos del polen que pueden provenir del csped, los rboles y Human resources officer. La causa de la rinitis Stage manager (rinitis alrgica perenne) son los alrgenos como los caros del polvo domstico, la caspa de las mascotas y las esporas del moho.  SNTOMAS   Secrecin nasal (congestin).  Goteo y picazn nasales con estornudos y Industrial/product designer. DIAGNSTICO  Su mdico puede ayudarlo a Actor alrgeno o los alrgenos que desencadenan sus sntomas. Si usted y su mdico no pueden Teacher, adult education cul es el alrgeno, pueden hacerse anlisis de sangre o estudios de la piel. TRATAMIENTO  La rinitis alrgica no tiene [place], pero puede controlarse mediante lo siguiente:  Medicamentos y vacunas contra la alergia (inmunoterapia).  Prevencin del alrgeno. La fiebre del heno a menudo puede tratarse con antihistamnicos en las formas de pldoras o aerosol nasal. Los antihistamnicos bloquean los efectos de la histamina. Existen medicamentos de venta libre que pueden ayudar con la congestin nasal y la hinchazn alrededor de los ojos. Consulte a su mdico antes de tomar o administrarse este medicamento.  Si la prevencin del alrgeno o el medicamento recetado no dan resultado, existen  muchos medicamentos nuevos que su mdico puede recetarle. Pueden usarse medicamentos ms fuertes si las medidas iniciales no son efectivas. Pueden aplicarse inyecciones desensibilizantes si los medicamentos y la prevencin no funcionan. La desensibilizacin ocurre cuando un paciente recibe vacunas constantes hasta que el cuerpo se vuelve menos sensible al alrgeno. Asegrese de Chartered certified accountant seguimiento con su mdico si los problemas continan. INSTRUCCIONES PARA EL CUIDADO EN EL HOGAR No es posible evitar por completo los alrgenos, pero puede reducir los sntomas al tomar medidas para limitar su exposicin a ellos. Es muy til saber exactamente a qu es alrgico para que pueda evitar sus desencadenantes especficos. SOLICITE ATENCIN MDICA SI:   Amber Harper.  Desarrolla una tos que no se detiene fcilmente (persistente).  Le falta el aire.  Comienza a tener sibilancias.  Los sntomas interfieren con las actividades diarias normales. Document Released: 09/29/2004 Document Revised: 10/10/2012 [facility] Patient Information 2015 [name]. This information is not intended to replace advice given to you by your health care provider. Make sure you discuss any questions you have with your health care provider.  Dolor abdominal (Abdominal Pain) El dolor de estmago (abdominal) puede tener muchas causas. [place] veces, el dolor de [place] no es peligroso. Muchos de [employer] de dolor de estmago pueden controlarse y tratarse en casa. CUIDADOS EN EL HOGAR   No tome medicamentos que lo ayuden a defecar (laxantes), salvo que su mdico se lo indique.  Solo tome los medicamentos que le haya indicado su mdico.  Coma o beba lo que le indique su mdico. Su mdico le dir si debe seguir una dieta especial. SOLICITE AYUDA SI:  No sabe cul  es la causa del dolor de [name].  Tiene dolor de estmago cuando siente ganas de vomitar (nuseas) o tiene colitis (diarrea).  Tiene dolor  durante la miccin o la evacuacin.  El dolor de estmago lo despierta de noche.  Tiene dolor de [name] empeora o [place] cuando come.  Tiene dolor de [name] empeora cuando come [employer].  Tiene fiebre. SOLICITE AYUDA DE INMEDIATO SI:   El dolor no desaparece en un plazo mximo de 2horas.  No deja de (vomitar).  El dolor cambia y se Administrator, sports solo en la parte derecha o izquierda del [place].  La materia fecal es sanguinolenta o de aspecto alquitranado. ASEGRESE DE QUE:   Comprende estas instrucciones.  Controlar su afeccin.  Recibir ayuda de inmediato si no mejora o si empeora. Document Released: 03/18/2008 Document Revised: 12/25/2012 [name] Patient Information 2015 [name]. This information is not intended to replace advice given to you by your health care provider. Make sure you discuss any questions you have with your health care provider.  Estreimiento (Constipation) Take Miralax as directed for constipation, lots of liquids. Make appointment with your doctor today Se llama constipacin cuando:  Elimina heces (defeca) menos de 3 veces por semana.  Tiene dificultad para defecar.  Las heces son secas y duras o son ms grandes que lo normal. CUIDADOS EN EL HOGAR   Consuma alimentos con alto contenido de [place]. Por ejemplo, frutas, vegetales, porotos y cereales integrales, como el arroz integral.  Evite los alimentos ricos en grasas y Location manager. Estos incluyen patatas fritas, hamburguesas, galletas, dulces y refrescos.  Si no consume suficientes alimentos ricos en fibras, tome productos que tengan agregado de fibra (suplementos).  Beba suficiente lquido para mantener el pis (orina) claro o de color amarillo plido.  Haga ejercicio en forma regular, o como lo indique su mdico.  Vaya al bao cuando sienta la necesidad de Landscape architect. No se aguante las ganas.  Solo tome los medicamentos que le haya indicado su mdico. No tome  medicamentos que le ayuden a Landscape architect (laxantes) sin antes consultarlo con su mdico. SOLICITE AYUDA DE INMEDIATO SI:   Observa sangre brillante en las heces (materia fecal).  El estreimiento dura ms de 4 das o [place].  Tiene dolor en el vientre (abdominal) o el trasero (recto).  Las heces son delgadas (como un lpiz).  Pierde peso de [place] inexplicable. ASEGRESE DE QUE:   Comprende estas instrucciones.  Controlar su afeccin.  Recibir ayuda de inmediato si no mejora o si empeora. Document Released: 01/22/2010 Document Revised: 12/25/2012 [facility] Patient Information 2015 [place]. This information is not intended to replace advice given to you by your health care provider. Make sure you discuss any questions you have with your health care provider.

## 2014-08-11 NOTE — ED Provider Notes (Signed)
CSN: [id number]     Arrival date & time 08/11/14  1301 History   First MD Initiated Contact with Patient 08/11/14 1318     Chief Complaint  Patient presents with  . Sore Throat  . Abdominal Pain   (Consider location/radiation/quality/duration/timing/severity/associated sxs/prior Treatment) HPI Comments: 29 year old Hispanic female who speaks very little [place] and is assisted by Shonna Chock, [place] is complaining of sore throat, PND for 4 days. She is also complaining of a fever 2 and 3 days ago of 101. She has a left earache.  Her second complaint is that of sudden severe mid abdominal pain and tenderness just above the umbilicus. No nausea vomiting or diarrhea associated with it. It is noted that she had a CT of the abdomen and pelvis on 06/19/2014 and had a completely normal exam.  The patient brought her instructions with her from her ED visit and was instructed to follow-up with her PCP on September 9. She was to call for an appointment but she has not done that yet. Her PCP is [employer].   Past Medical History  Diagnosis Date  . Headache(784.0)   . Arthritis    Past Surgical History  Procedure Laterality Date  . Cesarean section    . Mass excision Left 09/14/2012    Procedure: EXCISION MASS;  Surgeon: Ruby Cola, MD;  Location: [facility] OR;  Service: ENT;  Laterality: Left;  removal of tongue mass   Family History  Problem Relation Age of Onset  . Diabetes Mother   . Diabetes Father   . Diabetes Maternal Grandmother   . Diabetes Paternal Grandmother    History  Substance Use Topics  . Smoking status: Never Smoker   . Smokeless tobacco: Never Used  . Alcohol Use: No   OB History    No data available     Review of Systems  Constitutional: Positive for fever and activity change. Negative for chills, diaphoresis, appetite change and fatigue.  HENT: Positive for congestion, postnasal drip, rhinorrhea and sore throat. Negative for facial swelling.   Eyes: Negative.    Respiratory: Positive for cough. Negative for shortness of breath and wheezing.   Cardiovascular: Negative.  Negative for chest pain.  Gastrointestinal: Positive for abdominal pain. Negative for nausea, vomiting and constipation.  Genitourinary: Negative.   Musculoskeletal: Negative for neck pain and neck stiffness.  Skin: Negative for pallor and rash.  Neurological: Negative.     Allergies  Review of patient's allergies indicates no known allergies.  Home Medications   Prior to Admission medications   Medication Sig Start Date End Date Taking? Authorizing Provider  B Complex-C (SUPER B COMPLEX PO) Take by mouth.    Historical Provider, MD  cabergoline (DOSTINEX) 0.5 MG tablet 1/2 tablet twice a week 11/01/13   Elayne Snare, MD   BP 132/71 mmHg  Pulse 74  Temp(Src) 98.4 F (36.9 C) (Oral)  Resp 16  SpO2 99% Physical Exam  Constitutional: She is oriented to person, place, and time. She appears well-developed and well-nourished. No distress.  HENT:  Mouth/Throat: No oropharyngeal exudate.  Bilateral TMs are mildly retracted. No erythema or signs of effusion.  Oropharynx with minor erythema, much cobblestoning and copious clear PND.  Eyes: Conjunctivae and EOM are normal.  Neck: Normal range of motion. Neck supple.  Cardiovascular: Normal rate, regular rhythm and normal heart sounds.   No murmur heard. Pulmonary/Chest: Effort normal and breath sounds normal. No respiratory distress. She has no wheezes. She has no rales.  Abdominal: Soft.  Bowel sounds are normal. There is tenderness. There is no rebound.  Palpation to the mid abdomen just above the umbilicus produces moderate to severe pain. There is also tenderness in the right upper quadrant with equivocal Murphy sign. No peritoneal signs. Liver not palpable.   Musculoskeletal: Normal range of motion. She exhibits no edema or tenderness.  Lymphadenopathy:    She has no cervical adenopathy.  Neurological: She is alert and  oriented to person, place, and time. She exhibits normal muscle tone.  Skin: Skin is warm and dry.  Psychiatric: She has a normal mood and affect.  Nursing note and vitals reviewed.   ED Course  Procedures (including critical care time) Labs Review Labs Reviewed  POCT URINALYSIS DIP (DEVICE) - Abnormal; Notable for the following:    Hgb urine dipstick MODERATE (*)    All other components within normal limits  POCT PREGNANCY, URINE  POCT RAPID STREP A   Results for orders placed or performed during the hospital encounter of 08/11/14  POCT urinalysis dip (device)  Result Value Ref Range   Glucose, UA NEGATIVE NEGATIVE mg/dL   Bilirubin Urine NEGATIVE NEGATIVE   Ketones, ur NEGATIVE NEGATIVE mg/dL   Specific Gravity, Urine <=1.005 1.005 - 1.030   Hgb urine dipstick MODERATE (A) NEGATIVE   pH 5.5 5.0 - 8.0   Protein, ur NEGATIVE NEGATIVE mg/dL   Urobilinogen, UA 0.2 0.0 - 1.0 mg/dL   Nitrite NEGATIVE NEGATIVE   Leukocytes, UA NEGATIVE NEGATIVE  Pregnancy, urine POC  Result Value Ref Range   Preg Test, Ur NEGATIVE NEGATIVE  POCT rapid strep A [facility])  Result Value Ref Range   Streptococcus, Group A Screen (Direct) NEGATIVE NEGATIVE     Imaging Review Dg Abd 1 View  08/11/2014   CLINICAL DATA:  29 year old female with 2 day history of umbilical pain and Three day history of constipation  EXAM: ABDOMEN - 1 VIEW  COMPARISON:  Prior CT scan of the abdomen and pelvis 06/19/2014  FINDINGS: The bowel gas pattern is normal. The stool burden is unremarkable. No radio-opaque calculi or other significant radiographic abnormality are seen.  IMPRESSION: Negative.   Electronically Signed   By: Jacqulynn Cadet M.D.   On: 08/11/2014 14:14     MDM   1. Allergic rhinitis due to pollen   2. Allergic pharyngitis   3. ETD (eustachian tube dysfunction), left   4. Epigastric pain   5. Abdominal gas pain   6. Slow transit constipation   No acute abdomen. Much gas with bowel  distension and moderate stool burden, likely to be causing the tenderness and intermmitent abdominal pain. No signs of obstruction. CT abd last month reassuring. Allegra daily for allergies Flonase nasal spray and lots of fluids Take Miralax as directed for constipation, lots of liquids. Make appointment with your doctor today Go to the ED if worse or new symptoms. Call your Dr. For appt today     Janne Napoleon, NP 08/11/14 [place], NP 08/11/14 1431

## 2014-08-13 LAB — CULTURE, GROUP A STREP: Strep A Culture: NEGATIVE

## 2014-08-14 NOTE — ED Notes (Signed)
Final report of strep negative  

## 2014-08-21 ENCOUNTER — Ambulatory Visit: Payer: Self-pay | Attending: Internal Medicine | Admitting: Internal Medicine

## 2014-08-21 ENCOUNTER — Encounter: Payer: Self-pay | Admitting: Internal Medicine

## 2014-08-21 VITALS — BP 93/67 | HR 64 | Temp 99.1°F | Resp 16 | Ht <= 58 in | Wt 120.0 lb

## 2014-08-21 DIAGNOSIS — Z23 Encounter for immunization: Secondary | ICD-10-CM | POA: Insufficient documentation

## 2014-08-21 DIAGNOSIS — K5900 Constipation, unspecified: Secondary | ICD-10-CM | POA: Insufficient documentation

## 2014-08-21 DIAGNOSIS — R141 Gas pain: Secondary | ICD-10-CM | POA: Insufficient documentation

## 2014-08-21 MED ORDER — POLYETHYLENE GLYCOL 3350 17 GM/SCOOP PO POWD: 17.0000 g | Freq: Once | ORAL | Status: DC

## 2014-08-21 MED ORDER — DOCUSATE SODIUM 100 MG PO CAPS: 100.0000 mg | ORAL_CAPSULE | Freq: Two times a day (BID) | ORAL | Status: DC

## 2014-08-21 NOTE — Progress Notes (Signed)
Establish care with new PCP Complaining of  Bump on abdominal area close to navel  Pain with walking and running  Went to UC on 08/11/2014

## 2014-08-21 NOTE — Patient Instructions (Signed)

## 2014-08-21 NOTE — Progress Notes (Signed)
Patient ID: Amber Harper, female   DOB: 03/14/1985, 29 y.o.   MRN: [id number]  CC: bump  HPI: Amber Harper is a 29 y.o. female here today for a follow up visit.  Patient has past medical history of prolactinoma and arthritis. Patient reports that she was seen at the urgent care 10 days ago for abdominal pain. She was at that time told that her pain was due to gas and constipation. Today she notes the pain has increased since then. She reports that the pain is more severe when she is walking or running. She notes that she only has bowel movements every 2-3 days that are usually hard. She noticed a know in her stomach under her navel last month and is concerned what that area may be. Some mild tenderness to palpate area. Review of records reveal patient had CT of abdomen 2 months ago which was WNL.   No Known Allergies Past Medical History  Diagnosis Date  . Headache(784.0)   . Arthritis    Current Outpatient Prescriptions on File Prior to Visit  Medication Sig Dispense Refill  . B Complex-C (SUPER B COMPLEX PO) Take by mouth.    . cabergoline (DOSTINEX) 0.5 MG tablet 1/2 tablet twice a week 10 tablet 5   No current facility-administered medications on file prior to visit.   Family History  Problem Relation Age of Onset  . Diabetes Mother   . Diabetes Maternal Grandmother   . Diabetes Paternal Grandmother    Social History   Social History  . Marital Status: Married    Spouse Name: [name]  . Number of Children: 2  . Years of Education: 6    Occupational History  . Housewife    Social History Main Topics  . Smoking status: Never Smoker   . Smokeless tobacco: Never Used  . Alcohol Use: No  . Drug Use: No  . Sexual Activity: No   Other Topics Concern  . Not on file   Social History Narrative    Review of Systems: Other than what is stated in HPI, all other systems are negative.   Objective:   Filed Vitals:   08/21/14 0921  BP: 93/67  Pulse: 64   Temp: 99.1 F (37.3 C)  Resp: 16    Physical Exam  Cardiovascular: Normal rate, regular rhythm and normal heart sounds.   Pulmonary/Chest: Effort normal and breath sounds normal.  Abdominal: She exhibits no distension. There is no tenderness. No hernia.  Likely stool felt in area below navel   Skin: Skin is warm and dry.    Lab Results  Component Value Date   WBC 7.7 06/12/2014   HGB 14.3 06/12/2014   HCT 42.0 06/12/2014   MCV 88.6 06/12/2014   PLT 198 06/12/2014   Lab Results  Component Value Date   CREATININE 0.68 06/12/2014   BUN 14 06/12/2014   NA 133* 06/12/2014   K 4.2 06/12/2014   CL 102 06/12/2014   CO2 22 06/12/2014    Lab Results  Component Value Date   HGBA1C 5.5 06/12/2014   Lipid Panel     Component Value Date/Time   CHOL 151 11/27/2012 1035   TRIG 115 11/27/2012 1035   HDL 52 11/27/2012 1035   CHOLHDL 2.9 11/27/2012 1035   VLDL 23 11/27/2012 1035   LDLCALC 76 11/27/2012 1035       Assessment and plan:   Diagnoses and all orders for this visit:  Constipation, unspecified constipation type -  Begin docusate sodium (COLACE) 100 MG capsule; Take 1 capsule (100 mg total) by mouth 2 (two) times daily. -     Begin polyethylene glycol powder (GLYCOLAX/MIRALAX) powder; Take 17 g by mouth once. She may take Miralax in the mornings and colace before bed. Increase water, fruits, veggie intake.   Gas pain See above. Pain should resolve when stools become regular   Need for Tdap vaccination -     Tdap vaccine greater than or equal to 7yo IM  Due to language barrier, an interpreter was present during the history-taking and subsequent discussion (and for part of the physical exam) with this patient.  Return in about 10 days (around 08/31/2014) for abdominal pain .        Lance Bosch, [facility] [phone] 08/21/2014, 9:35 AM

## 2014-09-01 ENCOUNTER — Ambulatory Visit: Payer: Self-pay | Attending: Internal Medicine | Admitting: Internal Medicine

## 2014-09-01 ENCOUNTER — Encounter: Payer: Self-pay | Admitting: Internal Medicine

## 2014-09-01 VITALS — BP 104/70 | HR 65 | Temp 98.0°F | Resp 16 | Ht <= 58 in | Wt 121.4 lb

## 2014-09-01 DIAGNOSIS — R1084 Generalized abdominal pain: Secondary | ICD-10-CM

## 2014-09-01 DIAGNOSIS — N898 Other specified noninflammatory disorders of vagina: Secondary | ICD-10-CM

## 2014-09-01 LAB — POCT URINALYSIS DIPSTICK
BILIRUBIN UA: NEGATIVE
Glucose, UA: NEGATIVE
KETONES UA: NEGATIVE
Leukocytes, UA: NEGATIVE
Nitrite, UA: NEGATIVE
PH UA: 7
Protein, UA: NEGATIVE
SPEC GRAV UA: 1.015
Urobilinogen, UA: 0.2

## 2014-09-01 LAB — POCT URINE PREGNANCY: Preg Test, Ur: NEGATIVE

## 2014-09-01 MED ORDER — FLUCONAZOLE 150 MG PO TABS: 150.0000 mg | ORAL_TABLET | Freq: Once | ORAL | Status: DC

## 2014-09-01 MED ORDER — METRONIDAZOLE 0.75 % VA GEL: 1.0000 | Freq: Every day | VAGINAL | Status: DC

## 2014-09-01 NOTE — Progress Notes (Signed)
Patient ID: Amber Harper, female   DOB: [birth year], 29 y.o.   MRN: [id number]   CC: abdominal pain   HPI: Amber Harper is a 29 y.o. female here today for a follow up visit.  Patient has no past medical history. She reports that for the past week she has been using Miralax and colace. She states that she has had a bowel movement everyday but continues to feel like she is constipated. She notes that she has a pain above her navel that feels like a cramp.   Patient also reports that yesterday she noticed malodorous thick white vaginal discharge and itch. She reports that she gets frequent yeast infections but this time she feels like the OTC cream will not help. She reports that she has one sexual partner and has not been exposed to any STD's. She denies dysuria, flank pain, fever, chills.   No Known Allergies Past Medical History  Diagnosis Date  . Headache(784.0)   . Arthritis    Current Outpatient Prescriptions on File Prior to Visit  Medication Sig Dispense Refill  . docusate sodium (COLACE) 100 MG capsule Take 1 capsule (100 mg total) by mouth 2 (two) times daily. 30 capsule 0  . polyethylene glycol powder (GLYCOLAX/MIRALAX) powder Take 17 g by mouth once. 850 g 1  . B Complex-C (SUPER B COMPLEX PO) Take by mouth.    . cabergoline (DOSTINEX) 0.5 MG tablet 1/2 tablet twice a week 10 tablet 5   No current facility-administered medications on file prior to visit.   Family History  Problem Relation Age of Onset  . Diabetes Mother   . Diabetes Maternal Grandmother   . Diabetes Paternal Grandmother    Social History   Social History  . Marital Status: Married    Spouse Name: [name]  . Number of Children: 2  . Years of Education: 6    Occupational History  . Housewife    Social History Main Topics  . Smoking status: Never Smoker   . Smokeless tobacco: Never Used  . Alcohol Use: No  . Drug Use: No  . Sexual Activity: No   Other Topics Concern  . Not on  file   Social History Narrative    Review of Systems: Other than what is stated in HPI, all other systems are negative.   Objective:   Filed Vitals:   09/01/14 0924  BP: 104/70  Pulse: 65  Temp: 98 F (36.7 C)  Resp: 16    Physical Exam  Cardiovascular: Normal rate, regular rhythm and normal heart sounds.   Pulmonary/Chest: Effort normal and breath sounds normal.  Abdominal: Soft. Bowel sounds are normal. She exhibits no distension and no mass. There is no tenderness.     Lab Results  Component Value Date   WBC 7.7 06/12/2014   HGB 14.3 06/12/2014   HCT 42.0 06/12/2014   MCV 88.6 06/12/2014   PLT 198 06/12/2014   Lab Results  Component Value Date   CREATININE 0.68 06/12/2014   BUN 14 06/12/2014   NA 133* 06/12/2014   K 4.2 06/12/2014   CL 102 06/12/2014   CO2 22 06/12/2014    Lab Results  Component Value Date   HGBA1C 5.5 06/12/2014   Lipid Panel     Component Value Date/Time   CHOL 151 11/27/2012 1035   TRIG 115 11/27/2012 1035   HDL 52 11/27/2012 1035   CHOLHDL 2.9 11/27/2012 1035   VLDL 23 11/27/2012 1035   LDLCALC 76 11/27/2012 1035  Assessment and plan:   Diagnoses and all orders for this visit:  Vaginal discharge -     Urinalysis Dipstick -     POCT urine pregnancy -     metroNIDAZOLE (METROGEL VAGINAL) 0.75 % vaginal gel; Place 1 Applicatorful vaginally at bedtime. For 5 days -     fluconazole (DIFLUCAN) 150 MG tablet; Take 1 tablet (150 mg total) by mouth once. Will treat for vaginal infection and explained that vaginal infection may be causing some abdominal discomfort. If mo improvement after treatment she amy RTC for further evaluation.   Generalized abdominal pain -     Ambulatory referral to Gastroenterology See above. I want patient to continue using Miralax and colace for chronic constipation as well.   Due to language barrier, an interpreter was present during the history-taking and subsequent discussion (and for part of  the physical exam) with this patient.  Return if symptoms worsen or fail to improve.       Lance Bosch, [facility] [phone] 09/01/2014, 9:43 AM

## 2014-09-01 NOTE — Progress Notes (Signed)
Patient here for follow up on her constipation Patient has been taking miralax and colace but still Complaining of abd pain and pain above her belly button Patient also complaining of having vaginal itching with white discharge that Started yesterday

## 2014-09-03 ENCOUNTER — Encounter: Payer: Self-pay | Admitting: Internal Medicine

## 2014-09-24 ENCOUNTER — Encounter: Payer: Self-pay | Admitting: Nurse Practitioner

## 2014-09-24 ENCOUNTER — Ambulatory Visit (INDEPENDENT_AMBULATORY_CARE_PROVIDER_SITE_OTHER): Payer: Self-pay | Admitting: Nurse Practitioner

## 2014-09-24 VITALS — BP 100/59 | HR 65 | Temp 97.9°F | Ht <= 58 in | Wt 123.0 lb

## 2014-09-24 DIAGNOSIS — K5900 Constipation, unspecified: Secondary | ICD-10-CM

## 2014-09-24 DIAGNOSIS — D171 Benign lipomatous neoplasm of skin and subcutaneous tissue of trunk: Secondary | ICD-10-CM | POA: Insufficient documentation

## 2014-09-24 NOTE — Assessment & Plan Note (Signed)
Small pea-sized area of the midabdominal muscle seemed disappeared of the umbilicus noted which is mildly tender to palpation. This is most consistent with a benign lipoma. Offered an abdominal ultrasound to further evaluate, however the patient is declining at this time because of the expense. She does not currently have insurance at her [facility] assistance did not cover her CT. Instructed her to keep a close eye in the area there are any changes to be seen by either her primary care or [place]. She verbalized understanding to the translator and agrees to do so.

## 2014-09-24 NOTE — Progress Notes (Signed)
CC'ED TO PCP 

## 2014-09-24 NOTE — Progress Notes (Signed)
Primary Care Physician:  Lorayne Marek, MD Primary Gastroenterologist:  Dr. Gala Romney  Chief Complaint  Patient presents with  . Abdominal Pain    HPI:   29 year old female presents on referral from PCP for generalized abdominal pain and long-term constipation. Patient is Spanish-speaking and visit was assisted with the aid of a system translator. PCP notes reviewed. Patient was seen 08/21/2014 at her PCPs office for follow-up on abdominal pain. Was seen in urgent care for abdominal pain" and not near her umbilicus." Urgent care told her her symptoms were due to gas and constipation and she reports bowel movements every 2-3 days that are usually hard. She was started on daily Colace and daily MiraLAX 17 g. flat plate abdominal x-ray completed 08/11/2014 showed normal bowel gas pattern, unremarkable stool burden, no other radiographic abnormality for an overall negative x-ray. CT of the abdomen and pelvis with contrast completed 06/19/2014 also for lower abdominal pain found no evidence of bowel junction, normal appendix, no CT findings to account for patient's pain.  Today she states she was previously having constipation, including hard stools and only having a bowel movement about every 3 days. Also with abdominal pain at that time. With Colace and Miralax, is now having 1-2 bowel movements a day which are soft and formed. Abdominal pain has subsided. Is concerned about the "knott" on her stomach, which she first noticed a few months ago. Is always there. Is tender if she presses on it. Denies hematochezia, melena, fever, chills, unintentional weight loss, N/V.    Past Medical History  Diagnosis Date  . Headache(784.0)   . Arthritis     Past Surgical History  Procedure Laterality Date  . Cesarean section    . Mass excision Left 09/14/2012    Procedure: EXCISION MASS;  Surgeon: Ruby Cola, MD;  Location: [facility] OR;  Service: ENT;  Laterality: Left;  removal of tongue mass    Current  Outpatient Prescriptions  Medication Sig Dispense Refill  . B Complex-C (SUPER B COMPLEX PO) Take by mouth.    . cabergoline (DOSTINEX) 0.5 MG tablet 1/2 tablet twice a week 10 tablet 5  . docusate sodium (COLACE) 100 MG capsule Take 1 capsule (100 mg total) by mouth 2 (two) times daily. 30 capsule 0  . polyethylene glycol powder (GLYCOLAX/MIRALAX) powder Take 17 g by mouth once. 850 g 1  . fluconazole (DIFLUCAN) 150 MG tablet Take 1 tablet (150 mg total) by mouth once. (Patient not taking: Reported on 09/24/2014) 1 tablet 0  . metroNIDAZOLE (METROGEL VAGINAL) 0.75 % vaginal gel Place 1 Applicatorful vaginally at bedtime. For 5 days (Patient not taking: Reported on 09/24/2014) 70 g 0   No current facility-administered medications for this visit.    Allergies as of 09/24/2014  . (No Known Allergies)    Family History  Problem Relation Age of Onset  . Diabetes Mother   . Diabetes Maternal Grandmother   . Diabetes Paternal Grandmother     Social History   Social History  . Marital Status: Married    Spouse Name: [name]  . Number of Children: 2  . Years of Education: 6    Occupational History  . Housewife    Social History Main Topics  . Smoking status: Never Smoker   . Smokeless tobacco: Never Used  . Alcohol Use: No  . Drug Use: No  . Sexual Activity: No   Other Topics Concern  . Not on file   Social History Narrative  Review of Systems: General: Negative for anorexia, weight loss, fever, chills, fatigue, weakness. Eyes: Negative for vision changes.  ENT: Negative for hoarseness, difficulty swallowing. CV: Negative for chest pain, angina, palpitations, dyspnea on exertion, peripheral edema.  Respiratory: Negative for dyspnea at rest, dyspnea on exertion, cough, sputum, wheezing.  GI: See history of present illness. Derm: Negative for rash or itching.  Endo: Negative for unusual weight change.  Heme: Negative for bruising or bleeding. Allergy: Negative for rash or  hives.    Physical Exam: BP 100/59 mmHg  Pulse 65  Temp(Src) 97.9 F (36.6 C) (Oral)  Ht 4\' 9"  (1.448 m)  Wt 123 lb (55.792 kg)  BMI 26.61 kg/m2  LMP 09/15/2014 General:   Alert and oriented. Pleasant and cooperative. Well-nourished and well-developed. Spanish-speaking. Head:  Normocephalic and atraumatic. Eyes:  Without icterus, sclera clear and conjunctiva pink.  Ears:  Normal auditory acuity. Cardiovascular:  S1, S2 present without murmurs appreciated. Normal pulses noted. Extremities without clubbing or edema. Respiratory:  Clear to auscultation bilaterally. No wheezes, rales, or rhonchi. No distress.  Gastrointestinal:  +BS, rounded/obese but soft, and non-distended. No HSM noted. No guarding or rebound. Small pea-sized hardened area noted just superior to the umbilicus in the mid-abdominal muscle seam which is mildly tender to palpation most consistent to a small lipoma. No hernia noted when laying or flexing abdominal muscles.  Rectal:  Deferred  Musculoskalatal:  Symmetrical without gross deformities. Normal posture. Skin:  Intact without significant lesions or rashes. Neurologic:  Alert and oriented x4;  grossly normal neurologically. Psych:  Alert and cooperative. Normal mood and affect.   09/24/2014 1:37 PM

## 2014-09-24 NOTE — Patient Instructions (Signed)
English  1. Continue taking the stool softener (Colace) and MiraLAX for your constipation. 2. Return for a follow-up appointment if the bump under stomach changes.   Espanol  1. Seguir tomando Database administrator fecal ( Colace ) y MiraLAX para su estreimiento. 2. Volver para una visita de seguimiento si el bulto debajo cambios de [employer]

## 2014-09-24 NOTE — Assessment & Plan Note (Signed)
Constipation symptoms as well as associated abdominal pain has resolved with Colace and MiraLAX. Now having regular bowel movements, approximately 1-2 a day which are soft and do not require straining. Continue medications as previously directed and return for follow-up as needed for any new or worsening symptoms.

## 2014-10-02 NOTE — Telephone Encounter (Signed)
Error

## 2014-10-03 ENCOUNTER — Ambulatory Visit: Payer: Self-pay | Attending: Internal Medicine

## 2014-10-07 ENCOUNTER — Ambulatory Visit: Payer: Self-pay | Attending: Internal Medicine | Admitting: Pharmacist

## 2014-10-07 DIAGNOSIS — Z23 Encounter for immunization: Secondary | ICD-10-CM | POA: Insufficient documentation

## 2014-10-07 MED ORDER — INFLUENZA VAC SPLIT QUAD 0.5 ML IM SUSY
0.5000 mL | PREFILLED_SYRINGE | Freq: Once | INTRAMUSCULAR | Status: AC
  Administered 2014-10-07: 0.5 mL via INTRAMUSCULAR

## 2014-10-07 NOTE — Patient Instructions (Signed)
Influenza Virus Vaccine injection (Fluarix) Qu es este medicamento? La VACUNA ANTIGRIPAL ayuda a disminuir el riesgo de contraer la influenza, tambin conocida como la gripe. La vacuna solo ayuda a protegerle contra algunas cepas de influenza. Esta vacuna no ayuda a reducir Catering manager de contraer influenza pandmica [id number]. Este medicamento puede ser utilizado para otros usos; si tiene alguna pregunta consulte con su proveedor de atencin mdica o con su farmacutico. MARCAS COMERCIALES DISPONIBLES: Fluarix, Fluzone Qu le debo informar a mi profesional de la salud antes de tomar este medicamento? Necesita saber si usted presenta alguno de los siguientes problemas o situaciones: -trastorno de sangrado como hemofilia -fiebre o infeccin -sndrome de Guillain-Barre u otros problemas neurolgicos -problemas del sistema inmunolgico -infeccin por el virus de la inmunodeficiencia humana (VIH) o SIDA -niveles bajos de plaquetas en la sangre -esclerosis mltiple -una Risk analyst o inusual a las vacunas antigripales, a los huevos, protenas de pollo, al ltex, a la gentamicina, a otros medicamentos, alimentos, colorantes o conservantes -si est embarazada o buscando quedar embarazada -si est amamantando a un beb Cmo debo utilizar este medicamento? Esta vacuna se administra mediante inyeccin por va intramuscular. Lo administra un profesional de [employer]	[place]. Recibir una copia de informacin escrita sobre la vacuna antes de cada vacuna. Asegrese de leer este folleto cada vez cuidadosamente. Este folleto puede cambiar con frecuencia. Hable con su pediatra para informarse acerca del uso de este medicamento en nios. Puede requerir atencin especial. Sobredosis: Pngase en contacto inmediatamente con un centro toxicolgico o una sala de urgencia si usted cree que haya tomado demasiado medicamento. ATENCIN: [employer] es solo para usted. No comparta este medicamento con nadie. Qu sucede  si me olvido de una dosis? No se aplica en este caso. Qu puede interactuar con este medicamento? -quimioterapia o radioterapia -medicamentos que suprimen el sistema inmunolgico, tales como etanercept, anakinra, infliximab y adalimumab -medicamentos que tratan o previenen cogulos sanguneos, como warfarina -fenitona -medicamentos esteroideos, como la prednisona o la cortisona -teofilina -vacunas Puede ser que esta lista no menciona todas las posibles interacciones. Informe a su profesional de [employer]	[place] de [employer] productos a base de hierbas, medicamentos de [employer] o suplementos nutritivos que est tomando. Si usted fuma, consume bebidas alcohlicas o si utiliza drogas ilegales, indqueselo tambin a su profesional de [employer]	[place]. Algunas sustancias pueden interactuar con su medicamento. A qu debo estar atento al usar [employer]? Informe a su mdico o a Barrister's clerk de la [employer] todos los efectos secundarios que persistan despus de 3 das. Llame a su proveedor de atencin mdica si se presentan sntomas inusuales dentro de las 6 semanas posteriores a la vacunacin. Es posible que todava pueda contraer la gripe, pero la enfermedad no ser tan fuerte como normalmente. No puede contraer la gripe de esta vacuna. La vacuna antigripal no le protege contra resfros u otras enfermedades que pueden causar [place]. Debe vacunarse cada ao. Qu efectos secundarios puedo tener al [employer] este medicamento? Efectos secundarios que debe informar a su mdico o a Barrister's clerk de la salud tan pronto como sea posible: -reacciones alrgicas como erupcin cutnea, picazn o urticarias, hinchazn de la cara, labios o lengua Efectos secundarios que, por lo general, no requieren atencin mdica (debe informarlos a su mdico o a su profesional de la salud si persisten o si son molestos): -fiebre -dolor de cabeza -molestias y dolores musculares -dolor, sensibilidad, enrojecimiento o Database administrator de la inyeccin -cansancio o debilidad Puede ser que [employer]  no menciona todos los posibles efectos secundarios. Comunquese a su mdico por asesoramiento mdico [employer]. Usted puede informar los efectos secundarios a la FDA por telfono al 1-800-FDA-1088. Dnde debo guardar mi medicina? Esta vacuna se administra solamente en clnicas, farmacias, consultorio mdico u otro consultorio de un profesional de la salud y no Sports coach en su domicilio. ATENCIN: Este folleto es un resumen. Puede ser que no cubra toda la posible informacin. Si usted tiene preguntas acerca de esta medicina, consulte con su mdico, su farmacutico o su profesional de Technical sales engineer.  2015, Elsevier/Gold Standard. (2009-06-23 15:31:40)

## 2014-10-28 ENCOUNTER — Other Ambulatory Visit: Payer: Self-pay

## 2014-10-28 DIAGNOSIS — D352 Benign neoplasm of pituitary gland: Secondary | ICD-10-CM

## 2014-10-29 LAB — PROLACTIN: PROLACTIN: 13.8 ng/mL

## 2014-10-31 ENCOUNTER — Ambulatory Visit (INDEPENDENT_AMBULATORY_CARE_PROVIDER_SITE_OTHER): Payer: Self-pay | Admitting: Endocrinology

## 2014-10-31 ENCOUNTER — Encounter: Payer: Self-pay | Admitting: Endocrinology

## 2014-10-31 VITALS — BP 104/68 | HR 68 | Temp 97.9°F | Resp 14 | Ht <= 58 in | Wt 125.2 lb

## 2014-10-31 DIAGNOSIS — D352 Benign neoplasm of pituitary gland: Secondary | ICD-10-CM

## 2014-10-31 NOTE — Patient Instructions (Signed)
Stop cabergoline 

## 2014-10-31 NOTE — Progress Notes (Signed)
Amber Harper 29 y.o.             Chief complaint: Followup of prolactinoma  History of Present Illness:   She  had amenorrhea since 4/14 after stopping her birth control pill  Also since her last childbirth she had some degree of galactorrhea, mostly on expressing milk from her breasts. She was evaluated for above symptoms and found to have an elevated prolactin level of 227 in 11/2012. 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  She was started on Dostinex on 02/12/13 and had been taking a half tablet twice a week She did have nausea and little dizziness with the full tablet previously  Menses have been regular since early April 2015 and still coming every 26 days Also does not have any galactorrhea  Since her prolactin level had been low normal her dose was reduced to half tablet weekly on the Dostinex 0.5 mg in 04/2014  Her prolactin level is now stable and relatively lower on this visit   Lab Results  Component Value Date   PROLACTIN 13.8 10/28/2014   PROLACTIN 19.0 07/29/2014   PROLACTIN 4.7 04/29/2014   PROLACTIN 4.9 10/29/2013        Medication List       This list is accurate as of: 10/31/14  8:57 AM.  Always use your most recent med list.               cabergoline 0.5 MG tablet  Commonly known as:  DOSTINEX  1/2 tablet twice a week     docusate sodium 100 MG capsule  Commonly known as:  COLACE  Take 1 capsule (100 mg total) by mouth 2 (two) times daily.     polyethylene glycol powder powder  Commonly known as:  GLYCOLAX/MIRALAX  Take 17 g by mouth once.     SUPER B COMPLEX PO  Take by mouth.     vitamin E 1000 UNIT capsule  Take 1,000 Units by mouth daily.        Allergies: No Known Allergies  Past Medical History  Diagnosis Date  . Headache(784.0)   . Arthritis     Past Surgical History  Procedure Laterality Date  . Cesarean section    . Mass excision Left 09/14/2012    Procedure:  EXCISION MASS;  Surgeon: Ruby Cola, MD;  [facility];  Service: ENT;  Laterality: Left;  removal of tongue mass    Family History  Problem Relation Age of Onset  . Diabetes Mother   . Diabetes Maternal Grandmother   . Diabetes Paternal Grandmother     Social History:  reports that she has never smoked. She has never used smokeless tobacco. She reports that she does not drink alcohol or use illicit drugs.  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  She has had no galactorrhea and also has regular  menstrual cycles with Dostinex half tablet once a  week  Prolactin is consistently normal for over a year now even with reducing her dose in April  It is relatively lower compared to her last visit  We can now give her a trial of stopping the medication but will need to recheck prolactin in 2 months   She was advised to avoid pregnancy until at least next year   Followup with PCP for evaluation of fatigue and headaches; headaches are likely to be secondary to the small prolactinoma and especially with likely tumor shrinkage on Dostinex   [facility] 10/31/2014, 8:57 AM   Note: This office note was prepared with Dragon voice recognition system technology. Any transcriptional errors that result from this process are unintentional.

## 2014-12-26 ENCOUNTER — Other Ambulatory Visit: Payer: Self-pay

## 2014-12-26 DIAGNOSIS — D352 Benign neoplasm of pituitary gland: Secondary | ICD-10-CM

## 2014-12-27 LAB — PROLACTIN: Prolactin: 73.4 ng/mL

## 2014-12-31 ENCOUNTER — Encounter: Payer: Self-pay | Admitting: Endocrinology

## 2014-12-31 ENCOUNTER — Ambulatory Visit (INDEPENDENT_AMBULATORY_CARE_PROVIDER_SITE_OTHER): Payer: Self-pay | Admitting: Endocrinology

## 2014-12-31 VITALS — BP 110/64 | HR 65 | Temp 98.5°F | Resp 14 | Ht <= 58 in | Wt 126.4 lb

## 2014-12-31 DIAGNOSIS — D352 Benign neoplasm of pituitary gland: Secondary | ICD-10-CM

## 2014-12-31 MED ORDER — CABERGOLINE 0.5 MG PO TABS: ORAL_TABLET | ORAL | Status: DC

## 2014-12-31 NOTE — Progress Notes (Signed)
Amber Harper 29 y.o.             Chief complaint: Followup of prolactinoma  History of Present Illness:   She  had amenorrhea since 4/14 after stopping her birth control pill  Also since her last childbirth she had some degree of galactorrhea, mostly on expressing milk from her breasts. She was evaluated for above symptoms and found to have an elevated prolactin level of 227 in 11/2012. 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  She was started on Dostinex on 02/12/13 and had been taking a half tablet twice a week She did have nausea and little dizziness with the full tablet previously  Menses have been regular since early April 2015 each month Also does not have any galactorrhea  Since her prolactin level had been low normal consistently her Dostinex half tablet weekly was stopped in 10/16 and she is here for follow-up  She still has not had any prescription cycles are galactorrhea  Her prolactin level is now significantly higher again at 73   Lab Results  Component Value Date   PROLACTIN 73.4 12/26/2014   PROLACTIN 13.8 10/28/2014   PROLACTIN 19.0 07/29/2014   PROLACTIN 4.7 04/29/2014        Medication List       This list is accurate as of: 12/31/14  9:31 AM.  Always use your most recent med list.               cabergoline 0.5 MG tablet  Commonly known as:  DOSTINEX  1/2 tablet twice a week     docusate sodium 100 MG capsule  Commonly known as:  COLACE  Take 1 capsule (100 mg total) by mouth 2 (two) times daily.     polyethylene glycol powder powder  Commonly known as:  GLYCOLAX/MIRALAX  Take 17 g by mouth once.     SUPER B COMPLEX PO  Take by mouth.     vitamin E 1000 UNIT capsule  Take 1,000 Units by mouth daily. Reported on 12/31/2014        Allergies: No Known Allergies  Past Medical History  Diagnosis Date  . Headache(784.0)   . Arthritis     Past Surgical History  Procedure Laterality  Date  . Cesarean section    . Mass excision Left 09/14/2012    Procedure: EXCISION MASS;  Surgeon: Ruby Cola, MD;  Location: [facility] OR;  Service: ENT;  Laterality: Left;  removal of tongue mass    Family History  Problem Relation Age of Onset  . Diabetes Mother   . Diabetes Maternal Grandmother   . Diabetes Paternal Grandmother     Social History:  reports that she has never smoked. She has never used smokeless tobacco. She reports that she does not drink alcohol or use illicit drugs.  REVIEW of systems:     Physical Exam BP 110/64 mmHg  Pulse 65  Temp(Src) 98.5 F (36.9 C) (Oral)  Resp 14  Ht 4\' 9"  (1.448 m)  Wt 126 lb 6.4 oz (57.335 kg)  BMI 27.35 kg/m2  SpO2 96%  Not indicated  Assessment/Plan:   History of 6 mm Prolactinoma with baseline increased prolactin level of 227 and symptoms of amenorrhea, nonspecific headaches and galactorrhea.  Although her Dostinex was tapered off and stopped in 10/16 with continued normal prolactin levels.   Prolactin has gone up to 73 since then. She has not had any symptoms as he had been discussed with  the patient that the high prolactin indicates that the microadenoma is still active and she cannot stop her medication as yet  She will start back with a half tablet twice a week for now and repeat prolactin 6 weeks to decide on further dosage  Patient Instructions  Dostinex 2x per week then come for test     [facility] 12/31/2014, 9:31 AM   Note: This office note was prepared with Dragon voice recognition system technology. Any transcriptional errors that result from this process are unintentional.

## 2014-12-31 NOTE — Patient Instructions (Signed)
Dostinex 2x per week then come for test

## 2015-02-11 ENCOUNTER — Other Ambulatory Visit: Payer: Self-pay

## 2015-02-11 DIAGNOSIS — D352 Benign neoplasm of pituitary gland: Secondary | ICD-10-CM

## 2015-02-11 LAB — PROLACTIN: Prolactin: 74.6 ng/mL — ABNORMAL HIGH

## 2015-02-12 NOTE — Progress Notes (Signed)
Quick Note:  Please let patient know that the prolactin test is about the same. If she is taking the cabergoline half tablet twice a week she needs to increase it to 1 tablet twice a week , please confirm dosage ______

## 2015-03-05 MED FILL — CABERGOLINE 0.5 MG TABLET: 0.5 | 54 days supply | Qty: 8 | Fill #1

## 2015-04-01 ENCOUNTER — Ambulatory Visit: Payer: Self-pay | Attending: Internal Medicine

## 2015-04-28 ENCOUNTER — Encounter: Payer: Self-pay | Admitting: Internal Medicine

## 2015-04-28 ENCOUNTER — Ambulatory Visit: Payer: Self-pay | Attending: Internal Medicine | Admitting: Internal Medicine

## 2015-04-28 ENCOUNTER — Other Ambulatory Visit: Payer: Self-pay | Admitting: *Deleted

## 2015-04-28 ENCOUNTER — Other Ambulatory Visit: Payer: Self-pay

## 2015-04-28 VITALS — BP 106/72 | HR 75 | Temp 98.3°F | Wt 126.0 lb

## 2015-04-28 DIAGNOSIS — R509 Fever, unspecified: Secondary | ICD-10-CM | POA: Insufficient documentation

## 2015-04-28 DIAGNOSIS — Z79899 Other long term (current) drug therapy: Secondary | ICD-10-CM | POA: Insufficient documentation

## 2015-04-28 DIAGNOSIS — R0789 Other chest pain: Secondary | ICD-10-CM | POA: Insufficient documentation

## 2015-04-28 DIAGNOSIS — K5900 Constipation, unspecified: Secondary | ICD-10-CM | POA: Insufficient documentation

## 2015-04-28 DIAGNOSIS — N912 Amenorrhea, unspecified: Secondary | ICD-10-CM | POA: Insufficient documentation

## 2015-04-28 DIAGNOSIS — R059 Cough, unspecified: Secondary | ICD-10-CM

## 2015-04-28 DIAGNOSIS — D352 Benign neoplasm of pituitary gland: Secondary | ICD-10-CM

## 2015-04-28 DIAGNOSIS — R1084 Generalized abdominal pain: Secondary | ICD-10-CM | POA: Insufficient documentation

## 2015-04-28 DIAGNOSIS — J029 Acute pharyngitis, unspecified: Secondary | ICD-10-CM | POA: Insufficient documentation

## 2015-04-28 DIAGNOSIS — J069 Acute upper respiratory infection, unspecified: Secondary | ICD-10-CM | POA: Insufficient documentation

## 2015-04-28 DIAGNOSIS — H9201 Otalgia, right ear: Secondary | ICD-10-CM | POA: Insufficient documentation

## 2015-04-28 DIAGNOSIS — M1990 Unspecified osteoarthritis, unspecified site: Secondary | ICD-10-CM | POA: Insufficient documentation

## 2015-04-28 DIAGNOSIS — R05 Cough: Secondary | ICD-10-CM | POA: Insufficient documentation

## 2015-04-28 DIAGNOSIS — R51 Headache: Secondary | ICD-10-CM | POA: Insufficient documentation

## 2015-04-28 LAB — POCT URINALYSIS DIPSTICK
Bilirubin, UA: NEGATIVE
GLUCOSE UA: NEGATIVE
Ketones, UA: NEGATIVE
Leukocytes, UA: NEGATIVE
Nitrite, UA: NEGATIVE
Protein, UA: 30
Spec Grav, UA: >=1.03
UROBILINOGEN UA: 1
pH, UA: 6

## 2015-04-28 LAB — POCT URINE PREGNANCY: Preg Test, Ur: NEGATIVE

## 2015-04-28 LAB — POCT RAPID STREP A (OFFICE): Rapid Strep A Screen: NEGATIVE

## 2015-04-28 MED ORDER — POLYETHYLENE GLYCOL 3350 17 GM/SCOOP PO POWD: 17.0000 g | Freq: Once | ORAL | Status: DC

## 2015-04-28 MED FILL — CABERGOLINE 0.5 MG TABLET: 0.5 | 54 days supply | Qty: 8 | Fill #2

## 2015-04-28 NOTE — Patient Instructions (Signed)
Infeccin del tracto respiratorio superior, adultos (Upper Respiratory Infection, Adult) La mayora de las infecciones del tracto respiratorio superior estn causadas por un virus. Un infeccin del tracto respiratorio superior afecta la nariz, la garganta y las vas respiratorias superiores. El tipo ms comn de infeccin del tracto respiratorio superior es el resfro comn. [place] los medicamentos solamente como se lo haya indicado el mdico.  A fin de Best boy de garganta, haga grgaras con solucin salina templada o consuma caramelos para la tos, como se lo haya indicado el mdico.  Use un humidificador de vapor clido o inhale el vapor de la ducha para aumentar la humedad del aire. Esto facilitar la respiracin.  Beba suficiente lquido para mantener el pis (orina) claro o de color amarillo plido.  Tome sopas y caldos transparentes.  Siga una dieta saludable.  Descanse todo lo que sea necesario.  Regrese al [place] cuando la fiebre haya desaparecido o el mdico le diga que puede [place].  Es posible que deba quedarse en su casa durante un tiempo prolongado, para no transmitir la infeccin a los dems.  Prospect usar un barbijo y lavarse las manos con frecuencia para evitar el contagio del virus.  Si tiene asma, use el inhalador con mayor frecuencia.  No consuma ningn producto que contenga tabaco, lo que incluye cigarrillos, tabaco de Higher education careers adviser o Psychologist, sport and exercise. Si necesita ayuda para dejar de fumar, consulte al mdico. SOLICITE AYUDA SI:  Siente que empeora o que no mejora.  Los medicamentos no logran [employer].  Tiene escalofros.  La dificultad para Museum/gallery exhibitions officer.  Tiene mucosidad marrn o roja.  Tiene una secrecin amarilla o marrn de la Lawyer.  Le duele la cara, especialmente al inclinarse hacia adelante.  Tiene fiebre.  Tiene los ganglios del cuello hinchados.  Siente dolor al tragar.  Tiene zonas  blancas en la parte de atrs de la garganta. SOLICITE AYUDA DE INMEDIATO SI:   Los siguientes sntomas son muy intensos o constantes:  Dolor de [place].  Dolor de odos.  Dolor en la frente, detrs de los ojos y por encima de los pmulos (dolor sinusal).  Dolor en el pecho.  Tiene enfermedad pulmonar prolongada (crnica) y cualquiera de estos sntomas:  Sibilancias.  Tos prolongada.  Tos con sangre.  Cambio en la mucosidad habitual.  Presenta rigidez en el cuello.  Tiene cambios en:  La visin.  La audicin.  El pensamiento.  El [place] de nimo. ASEGRESE DE QUE:   Comprende estas instrucciones.  Controlar su afeccin.  Recibir ayuda de inmediato si no mejora o si empeora.   Esta informacin no tiene Marine scientist el consejo del mdico. Asegrese de hacerle al mdico cualquier pregunta que tenga.   Document Released: 05/24/2010 Document Revised: 05/06/2014 Elsevier Interactive Patient Education 2016 [employer].  Gastroenteritis viral (Viral Gastroenteritis)  La gastroenteritis viral tambin se llama gripe estomacal. La causa de esta enfermedad es un tipo de germen (virus). Puede provocar heces acuosas de manera repentina (diarrea) yvmitos. Esto puede llevar a la prdida de lquidos corporales(deshidratacin). Por lo general dura de 3 a 8 das. Generalmente desaparece sin tratamiento. CUIDADOS EN EL HOGAR  Beba gran cantidad de lquido para mantener el pis (orina) de tono claro o amarillo plido. Beba pequeas cantidades de lquido con frecuencia.  Consulte a su mdico como reponer la prdida de lquidos (rehidratacin).  Evite:  Alimentos que Science writer.  El alcohol.  Las bebidas gaseosas (carbonatadas).  El tabaco.  Jugos.  Bebidas con cafena.  Lquidos muy calientes o fros.  Alimentos muy grasos.  Comer mucha cantidad por vez.  Productos lcteos hasta pasar 24 a 48 horas sin heces acuosas.  Puede consumir alimentos  que tengan cultivos activos (probiticos). Estos cultivos puede encontrarlos en algunos tipos de yogur y suplementos.  Lave bien sus manos para evitar el contagio de la enfermedad.  Tome slo los medicamentos que le haya indicado el mdico. No administre aspirina a los nios. No tome medicamentos para mejorar la diarrea (antidiarreicos).  Consulte al mdico si puede seguir [employer] que [place] habitualmente.  Cumpla con los controles mdicos segn las indicaciones. SOLICITE AYUDA DE INMEDIATO SI:  No puede retener los lquidos.  No ha orinado al [employer] vez en 6 a 8 horas.  Comienza a sentir falta de [place].  Observa sangre en la orina, en las heces o en el vmito. Puede ser similar a la borra del caf  Siente dolor en el vientre (abdominal), que empeora o se sita en un pequeo punto (se localiza).  Contina vomitando o con diarrea.  Tiene fiebre.  El paciente es un nio menor de 3 meses y [place].  El paciente es un nio mayor de 3 meses y tiene fiebre o problemas que no desaparecen.  El paciente es un nio mayor de 3 meses y tiene fiebre o problemas que empeoran repentinamente.  El paciente es un beb y no tiene lgrimas cuando llora. ASEGRESE QUE:   Comprende estas instrucciones.  Controlar su enfermedad.  Solicitar ayuda de inmediato si no mejora o si empeora.   Esta informacin no tiene Marine scientist el consejo del mdico. Asegrese de hacerle al mdico cualquier pregunta que tenga.   Document Released: 05/08/2008 Document Revised: 03/14/2011 Elsevier Interactive Patient Education [employer].

## 2015-04-28 NOTE — Progress Notes (Signed)
Amber Harper, is a 30 y.o. female  [id number]  [id number]  DOB - Mar 18, 1985  CC:  Chief Complaint  Patient presents with  . URI    Sinus press;HA;fever'cough-dry; ear pain - R; ST; chest congestion x 4 dys  . Follow-up    Abdominal pain  - worse last night       HPI: Amber Harper is a 30 y.o. female here today to establish medical care.  Pt last seen here 8/16.  Has been seeing Endocrinology for prolactinoma since.  She of note had labs this am for prolactin and has f/u appt w/ Endocrine this Friday.  She c/o of sore throat, nasal congestion, right ear discomfort, and chest discomfort x 4 days.  Dry cough, sometimes clear phlegm production.  Her throat fills dry and it hurts.  Also c/o of diffuse abdominal discomfort x 1 day, having normal bowel movements though since started Miralax.  Last bm about 2 hours ago, normal consistancy, denies brbpr/diarrhea.  Subjective fevers, no sick contacts.  She states she and husband are trying to have children, but do not know if able to w/ the prolactinoma.  Patient has No headache, No chest pain, No abdominal pain - No Nausea, No new weakness tingling or numbness, No Cough - SOB.  Spanish Intepretor used via [employer].  No Known Allergies Past Medical History  Diagnosis Date  . Headache(784.0)   . Arthritis    Current Outpatient Prescriptions on File Prior to Visit  Medication Sig Dispense Refill  . B Complex-C (SUPER B COMPLEX PO) Take by mouth.    . cabergoline (DOSTINEX) 0.5 MG tablet 1/2 tablet twice a week 12 tablet 1  . docusate sodium (COLACE) 100 MG capsule Take 1 capsule (100 mg total) by mouth 2 (two) times daily. (Patient not taking: Reported on 12/31/2014) 30 capsule 0  . vitamin E 1000 UNIT capsule Take 1,000 Units by mouth daily. Reported on 04/28/2015     No current facility-administered medications on file prior to visit.   Family History  Problem Relation Age of Onset  . Diabetes Mother   .  Diabetes Maternal Grandmother   . Diabetes Paternal Grandmother    Social History   Social History  . Marital Status: Married    Spouse Name: [name]  . Number of Children: 2  . Years of Education: 6    Occupational History  . Housewife    Social History Main Topics  . Smoking status: Never Smoker   . Smokeless tobacco: Never Used  . Alcohol Use: No  . Drug Use: No  . Sexual Activity: No   Other Topics Concern  . Not on file   Social History Narrative    Review of Systems: Constitutional: Negative for fever, chills, diaphoresis, activity change, appetite change and fatigue. HENT: Negative for ear pain, nosebleeds, congestion, facial swelling, rhinorrhea, neck pain, neck stiffness and ear discharge.  Eyes: Negative for pain, discharge, redness, itching and visual disturbance. Respiratory: Negative for cough, choking, chest tightness, shortness of breath, wheezing and stridor.  Cardiovascular: Negative for chest pain, palpitations and leg swelling. Gastrointestinal: Negative for abdominal distention. Genitourinary: Negative for dysuria, urgency, frequency, hematuria, flank pain, decreased urine volume, difficulty urinating and dyspareunia.  Musculoskeletal: Negative for back pain, joint swelling, arthralgia and gait problem. Neurological: Negative for dizziness, tremors, seizures, syncope, facial asymmetry, speech difficulty, weakness, light-headedness, numbness and headaches.  Hematological: Negative for adenopathy. Does not bruise/bleed easily. Psychiatric/Behavioral: Negative for hallucinations, behavioral problems, confusion, dysphoric mood, decreased concentration  and agitation.    Objective:   Filed Vitals:   04/28/15 1631  BP: 106/72  Pulse: 75  Temp: 98.3 F (36.8 C)    Physical Exam: Constitutional: Patient appears well-developed and well-nourished. No distress. AAOx3.  Non-toxic appearing. HENT: Normocephalic, atraumatic, External right and left ear normal.  bilat TMs clear. Oropharynx is raw., no exudate noted. Eyes: Conjunctivae and EOM are normal. PERRL, no scleral icterus. Neck: Normal ROM. Neck supple. No JVD.  CVS: RRR, S1/S2 +, no murmurs, no gallops, no carotid bruit.  Pulmonary: Effort and breath sounds normal, no stridor, rhonchi, wheezes, rales.  Abdominal: Soft. BS +, no distension, tenderness, rebound or guarding.  Musculoskeletal: Normal range of motion. No edema and no tenderness.  LE: bilat/ no c/c/e Skin: Skin is warm and dry. No rash noted. Not diaphoretic. No erythema. No pallor. Psychiatric: Normal mood and affect. Behavior, judgment, thought content normal.  Lab Results  Component Value Date   WBC 7.7 06/12/2014   HGB 14.3 06/12/2014   HCT 42.0 06/12/2014   MCV 88.6 06/12/2014   PLT 198 06/12/2014   Lab Results  Component Value Date   CREATININE 0.68 06/12/2014   BUN 14 06/12/2014   NA 133* 06/12/2014   K 4.2 06/12/2014   CL 102 06/12/2014   CO2 22 06/12/2014    Lab Results  Component Value Date   HGBA1C 5.5 06/12/2014   Lipid Panel     Component Value Date/Time   CHOL 151 11/27/2012 1035   TRIG 115 11/27/2012 1035   HDL 52 11/27/2012 1035   CHOLHDL 2.9 11/27/2012 1035   VLDL 23 11/27/2012 1035   LDLCALC 76 11/27/2012 1035       Depression screen PHQ 2/9 04/28/2015 08/21/2014 07/10/2012  Decreased Interest 0 0 0  Down, Depressed, Hopeless 0 0 0  PHQ - 2 Score 0 0 0  Altered sleeping 0 - -  Tired, decreased energy 0 - -  Change in appetite 0 - -  Feeling bad or failure about yourself  0 - -  Trouble concentrating 0 - -  Moving slowly or fidgety/restless 0 - -  Suicidal thoughts 0 - -  PHQ-9 Score 0 - -  Difficult doing work/chores Not difficult at all - -    Assessment and plan:   1. Sore throat, suspect viral uri - recd hydration/sx relief. - POCT rapid strep A  -neg  2. Ear pain, right See above, no infection noted  3. Fever, unspecified, subjective - suspect viral,  - POCT rapid  strep A neg  4. Constipation, unspecified constipation type Doing better now on miralax - polyethylene glycol powder (GLYCOLAX/MIRALAX) powder; Take 17 g by mouth once.  Dispense: 850 g; Refill: 1  5. Generalized abdominal pain - suspect viral gastroenteritis, recd fluids/sx relief. - POCT urine pregnancy - neg - Urinalysis Dipstick + blood, neg nitrates/le, will send for cx - CBC with Differential - BASIC METABOLIC PANEL WITH GFR  6. Prolactinoma - had prolactin levels done this am, f/u w/ Endo this Friday.  7. Pregnancy chances - I d/w pt that the odds of becoming pregnant w/ her prolactin levels abnormal are low (due to amenorrhea).  However, with close f/u w/ Endocrine and normalization of levels of prolactin, pregnancy is certainly possible.  Encouraged close Endocrine f/u.   Return in about 2 months (around 06/28/2015), or if symptoms worsen or fail to improve.  The patient was given clear instructions to go to ER or return to medical center  if symptoms don't improve, worsen or new problems develop. The patient verbalized understanding. The patient was told to call to get lab results if they haven't heard anything in the next week.      Maren Reamer, MD, [facility] [place], [place]   04/28/2015, 4:57 PM

## 2015-04-29 LAB — CBC WITH DIFFERENTIAL/PLATELET
Basophils Absolute: 0 cells/uL (ref 0–200)
Basophils Relative: 0 %
Eosinophils Absolute: 910 cells/uL — ABNORMAL HIGH (ref 15–500)
Eosinophils Relative: 13 %
HCT: 39.4 % (ref 35.0–45.0)
Hemoglobin: 13.5 g/dL (ref 11.7–15.5)
LYMPHS ABS: 1260 {cells}/uL (ref 850–3900)
LYMPHS PCT: 18 %
MCH: 30.4 pg (ref 27.0–33.0)
MCHC: 34.3 g/dL (ref 32.0–36.0)
MCV: 88.7 fL (ref 80.0–100.0)
MPV: 11.4 fL (ref 7.5–12.5)
Monocytes Absolute: 490 cells/uL (ref 200–950)
Monocytes Relative: 7 %
NEUTROS ABS: 4340 {cells}/uL (ref 1500–7800)
Neutrophils Relative %: 62 %
PLATELETS: 163 10*3/uL (ref 140–400)
RBC: 4.44 MIL/uL (ref 3.80–5.10)
RDW: 13.3 % (ref 11.0–15.0)
WBC: 7 10*3/uL (ref 3.8–10.8)

## 2015-04-29 LAB — BASIC METABOLIC PANEL WITH GFR
BUN: 14 mg/dL (ref 7–25)
CALCIUM: 8.8 mg/dL (ref 8.6–10.2)
CHLORIDE: 107 mmol/L (ref 98–110)
CO2: 26 mmol/L (ref 20–31)
CREATININE: 0.84 mg/dL (ref 0.50–1.10)
GFR, Est African American: >89 mL/min (ref >=60)
GFR, Est Non African American: >89 mL/min (ref >=60)
Glucose, Bld: 109 mg/dL — ABNORMAL HIGH (ref 65–99)
Potassium: 4.3 mmol/L (ref 3.5–5.3)
Sodium: 141 mmol/L (ref 135–146)

## 2015-04-29 LAB — PROLACTIN: Prolactin: 5.3 ng/mL (ref 4.8–23.3)

## 2015-04-30 ENCOUNTER — Other Ambulatory Visit: Payer: Self-pay | Admitting: Internal Medicine

## 2015-04-30 LAB — URINE CULTURE

## 2015-04-30 MED ORDER — SULFAMETHOXAZOLE-TRIMETHOPRIM 400-80 MG PO TABS: 1.0000 | ORAL_TABLET | Freq: Two times a day (BID) | ORAL | Status: DC

## 2015-05-01 ENCOUNTER — Encounter: Payer: Self-pay | Admitting: Endocrinology

## 2015-05-01 ENCOUNTER — Ambulatory Visit (INDEPENDENT_AMBULATORY_CARE_PROVIDER_SITE_OTHER): Payer: Self-pay | Admitting: Endocrinology

## 2015-05-01 VITALS — BP 102/68 | HR 75 | Temp 98.0°F | Resp 14 | Ht <= 58 in | Wt 125.6 lb

## 2015-05-01 DIAGNOSIS — D352 Benign neoplasm of pituitary gland: Secondary | ICD-10-CM

## 2015-05-01 MED FILL — SULFAMETHOXAZOLE-TMP SS TAB: 400-80 | 3 days supply | Qty: 6 | Fill #0

## 2015-05-01 NOTE — Progress Notes (Signed)
Amber Harper 30 y.o.             Chief complaint: Followup of prolactinoma  History of Present Illness:   She  had amenorrhea since 4/14 after stopping her birth control pill  Also since her last childbirth she had some degree of galactorrhea, mostly on expressing milk from her breasts. She was evaluated for above symptoms and found to have an elevated prolactin level of 227 in 11/2012. 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  She has now been taking her Dostinex regularly, half tablet on Tuesdays and Thursdays  Her menstrual cycles have been regular, no milk discharged from her breasts  Prolactin is back to normal She is again asking about a pregnancy. Apparently she had a second opinion at [place] and MRI was done which 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   Lab Results  Component Value Date   PROLACTIN 5.3 04/28/2015   PROLACTIN 74.6* 02/11/2015   PROLACTIN 73.4 12/26/2014   PROLACTIN 13.8 10/28/2014        Medication List       This list is accurate as of: 05/01/15 12:01 PM.  Always use your most recent med list.               cabergoline 0.5 MG tablet  Commonly known as:  DOSTINEX  1/2 tablet twice a week     folic acid A999333 MCG tablet  Commonly known as:  FOLVITE  Take 400 mcg by mouth daily.        Allergies: No Known  Allergies  Past Medical History  Diagnosis Date  . Headache(784.0)   . Arthritis     Past Surgical History  Procedure Laterality Date  . Cesarean section    . Mass excision Left 09/14/2012    Procedure: EXCISION MASS;  Surgeon: Ruby Cola, MD;  Location: [facility];  Service: ENT;  Laterality: Left;  removal of tongue mass    Family History  Problem Relation Age of Onset  . Diabetes Mother   . Diabetes Maternal Grandmother   . Diabetes Paternal Grandmother     Social History:  reports that she has never smoked. She has never used smokeless tobacco. She reports that she does not drink alcohol or use illicit drugs.       Physical Exam BP 102/68 mmHg  Pulse 75  Temp(Src) 98 F (36.7 C)  Resp 14  Ht 4\' 9"  (1.448 m)  Wt 125 lb 9.6 oz (56.972 kg)  BMI 27.17 kg/m2  SpO2 95%  LMP 04/23/2015 (Exact Date)  Not indicated  Assessment/Plan:   History of 6 mm Prolactinoma with baseline increased prolactin level of 227 and symptoms of amenorrhea, nonspecific headaches and galactorrhea.  She continues to require Dostinex, half tablet twice a week Her prolactin level is normal again  The patient is again asking about pregnancy  and discussed that it would be preferable to have her prolactinoma consistently controlled before recommending this. Explained to her that she may not be able to take any medications during pregnancy and there is a possibility of the prolactinoma enlarging during pregnancy  She will have repeat prolactin level done in 2 months and may consider reducing the dose to once a week  Patient Instructions  Take Rx on Tuesdays and Fridays       [facility] 05/01/2015, 12:01 PM   Note: This office note was prepared with Estate agent. Any transcriptional errors that result from this process are unintentional.

## 2015-05-01 NOTE — Patient Instructions (Addendum)
Take Rx on Tuesdays and Fridays

## 2015-06-17 ENCOUNTER — Encounter: Payer: Self-pay | Admitting: Physician Assistant

## 2015-06-17 ENCOUNTER — Ambulatory Visit: Payer: Self-pay | Attending: Internal Medicine | Admitting: Physician Assistant

## 2015-06-17 ENCOUNTER — Other Ambulatory Visit: Payer: Self-pay

## 2015-06-17 VITALS — BP 107/71 | HR 71 | Temp 98.5°F | Resp 18 | Ht <= 58 in | Wt 126.0 lb

## 2015-06-17 DIAGNOSIS — R079 Chest pain, unspecified: Secondary | ICD-10-CM | POA: Insufficient documentation

## 2015-06-17 DIAGNOSIS — J069 Acute upper respiratory infection, unspecified: Secondary | ICD-10-CM

## 2015-06-17 DIAGNOSIS — J029 Acute pharyngitis, unspecified: Secondary | ICD-10-CM

## 2015-06-17 DIAGNOSIS — Z79899 Other long term (current) drug therapy: Secondary | ICD-10-CM | POA: Insufficient documentation

## 2015-06-17 DIAGNOSIS — R05 Cough: Secondary | ICD-10-CM | POA: Insufficient documentation

## 2015-06-17 DIAGNOSIS — B9789 Other viral agents as the cause of diseases classified elsewhere: Secondary | ICD-10-CM

## 2015-06-17 DIAGNOSIS — J028 Acute pharyngitis due to other specified organisms: Secondary | ICD-10-CM

## 2015-06-17 MED ORDER — GUAIFENESIN 100 MG/5ML PO SOLN: 5.0000 mL | ORAL | Status: DC | PRN

## 2015-06-17 MED ORDER — BENZONATATE 100 MG PO CAPS: 100.0000 mg | ORAL_CAPSULE | Freq: Two times a day (BID) | ORAL | Status: DC | PRN

## 2015-06-17 MED ORDER — AZITHROMYCIN 250 MG PO TABS: ORAL_TABLET | ORAL | Status: DC

## 2015-06-17 MED FILL — ROBAFEN 100 MG/5 ML SYRUP: 100 | 16 days supply | Qty: 473 | Fill #0

## 2015-06-17 MED FILL — AZITHROMYCIN 250 MG TABLET: 250 | 5 days supply | Qty: 6 | Fill #0

## 2015-06-17 MED FILL — BENZONATATE 100 MG CAPSULE: 100 | 10 days supply | Qty: 20 | Fill #0

## 2015-06-17 NOTE — Progress Notes (Signed)
Chief Complaint: "chest pain and cough"  Subjective: This is a pleasant 30 year old female who was seen 6 weeks ago for upper respiratory symptoms. She was treated symptomatically. She continues to have sore throat, difficulty swallowing, chest pain, and cough productive of yellow phlegm. Her head is hurting. She feels congested in the nasal area and in the head. She's had some nausea but no vomiting. She is wanting an antibiotic.   ROS:  GEN: denies fever or chills, denies change in weight Skin: denies lesions or rashes HEENT: + headache, earache, epistaxis, +sore throat, no neck pain LUNGS: denies SHOB, dyspnea, PND, orthopnea +cough w/ phlegm NEURO: denies numbness or tingling, denies sz, stroke or TIA   Objective:  Filed Vitals:   06/17/15 1035  BP: 107/71  Pulse: 71  Temp: 98.5 F (36.9 C)  TempSrc: Oral  Resp: 18  Height: 4\' 6"  (1.372 m)  Weight: 126 lb (57.153 kg)  SpO2: 97%    Physical Exam:  General: in no acute distress. HEENT: no pallor, no icterus, moist oral mucosa, but injected no exudates no JVD, +cervical lymphadenopathy Heart: Normal  s1 &s2  Regular rate and rhythm, without murmurs, rubs, gallops. Lungs: Clear to auscultation bilaterally. Neuro: Alert, awake, oriented x3, nonfocal.  Pertinent Lab Results:EKG-NSR   Medications: Prior to Admission medications   Medication Sig Start Date End Date Taking? Authorizing Provider  cabergoline (DOSTINEX) 0.5 MG tablet 1/2 tablet twice a week 12/31/14  Yes Elayne Snare, MD  folic acid (FOLVITE) A999333 MCG tablet Take 400 mcg by mouth daily.   Yes Historical Provider, MD  azithromycin (ZITHROMAX) 250 MG tablet 2 tabs today Then 1 tab daily for 4 days 06/17/15   Brayton Caves, PA-C  benzonatate (TESSALON) 100 MG capsule Take 1 capsule (100 mg total) by mouth 2 (two) times daily as needed for cough. 06/17/15   Ulric Salzman Daneil Dan, PA-C  guaiFENesin (ROBITUSSIN) 100 MG/5ML SOLN Take 5 mLs (100 mg total) by mouth every 4  (four) hours as needed for cough or to loosen phlegm. 06/17/15   Brayton Caves, PA-C    Assessment: 1. URI, likely vial 2. Cough  Plan: Zpak Tessalon perles Robitussin Symptom control  Follow up:prn  The patient was given clear instructions to go to ER or return to medical center if symptoms don't improve, worsen or new problems develop. The patient verbalized understanding. The patient was told to call to get lab results if they haven't heard anything in the next week.   This note has been created with Surveyor, quantity. Any transcriptional errors are unintentional.   Zettie Pho, PA-C 06/17/2015, 10:41 AM

## 2015-06-17 NOTE — Progress Notes (Signed)
Patient is here for Chest Discomfort  Patient complains of chest discomfort from coughing and trouble breathing. Pain is scaled currently at an 8.  Patient has taken medication last night. Patient has not eaten today.

## 2015-06-17 NOTE — Addendum Note (Signed)
Addended by: Trecia Rogers on: 06/17/2015 11:07 AM   Modules accepted: SmartSet

## 2015-07-01 ENCOUNTER — Other Ambulatory Visit: Payer: Self-pay

## 2015-07-01 DIAGNOSIS — D352 Benign neoplasm of pituitary gland: Secondary | ICD-10-CM

## 2015-07-02 LAB — PROLACTIN: Prolactin: 4.5 ng/mL — ABNORMAL LOW (ref 4.8–23.3)

## 2015-07-15 ENCOUNTER — Ambulatory Visit: Payer: Self-pay | Attending: Family Medicine

## 2015-07-15 MED FILL — CABERGOLINE 0.5 MG TABLET: 0.5 | 35 days supply | Qty: 8 | Fill #0

## 2015-08-21 MED FILL — AMOXICILLIN 500 MG CAPSULE: 500 | 30 days supply | Qty: 30 | Fill #0

## 2015-08-21 MED FILL — IBUPROFEN 800 MG TABLET: 800 | 5 days supply | Qty: 20 | Fill #0

## 2015-08-31 ENCOUNTER — Other Ambulatory Visit: Payer: Self-pay

## 2015-09-01 ENCOUNTER — Other Ambulatory Visit: Payer: Self-pay

## 2015-09-01 ENCOUNTER — Other Ambulatory Visit: Payer: Self-pay | Admitting: Endocrinology

## 2015-09-01 DIAGNOSIS — D352 Benign neoplasm of pituitary gland: Secondary | ICD-10-CM

## 2015-09-02 LAB — PROLACTIN: Prolactin: 12.6 ng/mL (ref 4.8–23.3)

## 2015-09-03 ENCOUNTER — Ambulatory Visit (INDEPENDENT_AMBULATORY_CARE_PROVIDER_SITE_OTHER): Payer: Self-pay | Admitting: Endocrinology

## 2015-09-03 ENCOUNTER — Encounter: Payer: Self-pay | Admitting: Endocrinology

## 2015-09-03 VITALS — BP 96/70 | HR 60 | Temp 98.8°F | Wt 126.0 lb

## 2015-09-03 DIAGNOSIS — D352 Benign neoplasm of pituitary gland: Secondary | ICD-10-CM

## 2015-09-03 NOTE — Progress Notes (Signed)
Amber Harper 30 y.o.             Chief complaint: Followup of prolactinoma  History of Present Illness:   She  had amenorrhea since 4/14 after stopping her birth control pill  Also since her last childbirth she had some degree of galactorrhea, mostly on expressing milk from her breasts. She was evaluated for above symptoms and found to have an elevated prolactin level of 227 in 11/2012. 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  She was started on Dostinex on 02/12/13 and had been taking a half tablet twice a week She did have nausea and little dizziness with the full tablet previously  Menses have been regular since early April 2015 each month Also does not have any galactorrhea   Since her prolactin level had been low normal consistently her Dostinex half tablet weekly was stopped in 10/16; however with her prolactin level going up to 73 this was  restarted in 2/17 Apparently she had a second opinion at [facility] and MRI was done which 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  Her prolactin level was some normal in 6/17 and she was told to reduce her Dostinex to once a week, half tablet She has now been taking her Dostinex regularly  Her menstrual cycles have been regular, no milk discharged from her breasts  Prolactin is back to normal    Lab Results  Component Value Date   PROLACTIN 12.6 09/01/2015   PROLACTIN 4.5 (L) 07/01/2015   PROLACTIN 5.3 04/28/2015   PROLACTIN 74.6 (H) 02/11/2015        Medication List       Accurate as of 09/03/15  2:58 PM. Always use your most recent med list.          amoxicillin 500 MG tablet Commonly known as:  AMOXIL Take 500 mg by mouth 3 (three) times daily.   b complex vitamins tablet Take 1 tablet by mouth daily.   cabergoline 0.5 MG tablet Commonly known as:  DOSTINEX 1/2 tablet twice a week   folic acid A999333 MCG tablet Commonly known as:  FOLVITE Take 400 mcg by mouth  daily.       Allergies: No Known Allergies  Past Medical History:  Diagnosis Date  . Arthritis   . [id number]     Past Surgical History:  Procedure Laterality Date  . CESAREAN SECTION    . MASS EXCISION Left 09/14/2012   Procedure: EXCISION MASS;  Surgeon: Ruby Cola, MD;  Location: [facility] OR;  Service: ENT;  Laterality: Left;  removal of tongue mass    Family History  Problem Relation Age of Onset  . Diabetes Mother   . Diabetes Maternal Grandmother   . Diabetes Paternal Grandmother     Social History:  reports that she has never smoked. She has never used smokeless tobacco. She reports that she does not drink alcohol or use drugs.       Physical Exam BP 96/70 (BP Location: Left Arm, Patient Position: Sitting, Cuff Size: Normal)   Pulse 60   Temp 98.8 F (37.1 C) (Oral)   Wt 126 lb (57.2 kg)   BMI 30.38 kg/m   Not indicated  Assessment/Plan:   History of 6 mm Prolactinoma with baseline increased prolactin level of 227 and symptoms of amenorrhea, nonspecific headaches and galactorrhea.  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 Any transcriptional errors that result from this process are unintentional.

## 2015-09-03 NOTE — Patient Instructions (Signed)
Take for 8 weeks more then stop

## 2015-10-07 MED FILL — ACETAMINOPHEN/COD #3 TABLET: 300-30 | 2 days supply | Qty: 8 | Fill #0

## 2015-10-08 MED FILL — AMOXICILLIN 500 MG CAPSULE: 500 | 7 days supply | Qty: 21 | Fill #0

## 2015-10-09 MED FILL — HYDROCODON-APAP 5-325: 5-325 | 2 days supply | Qty: 12 | Fill #0

## 2015-11-17 ENCOUNTER — Ambulatory Visit: Payer: Self-pay | Attending: Internal Medicine

## 2015-12-02 ENCOUNTER — Encounter: Payer: Self-pay | Admitting: Internal Medicine

## 2015-12-02 ENCOUNTER — Ambulatory Visit: Payer: Self-pay | Attending: Internal Medicine | Admitting: Internal Medicine

## 2015-12-02 VITALS — BP 96/67 | HR 59 | Temp 98.2°F | Resp 16 | Wt 123.0 lb

## 2015-12-02 DIAGNOSIS — Z79899 Other long term (current) drug therapy: Secondary | ICD-10-CM | POA: Insufficient documentation

## 2015-12-02 DIAGNOSIS — K029 Dental caries, unspecified: Secondary | ICD-10-CM

## 2015-12-02 DIAGNOSIS — D352 Benign neoplasm of pituitary gland: Secondary | ICD-10-CM

## 2015-12-02 DIAGNOSIS — Z131 Encounter for screening for diabetes mellitus: Secondary | ICD-10-CM

## 2015-12-02 DIAGNOSIS — Z833 Family history of diabetes mellitus: Secondary | ICD-10-CM | POA: Insufficient documentation

## 2015-12-02 DIAGNOSIS — N760 Acute vaginitis: Secondary | ICD-10-CM

## 2015-12-02 LAB — POCT GLYCOSYLATED HEMOGLOBIN (HGB A1C): Hemoglobin A1C: 5.4

## 2015-12-02 MED ORDER — SACCHAROMYCES BOULARDII 250 MG PO CAPS: 250.0000 mg | ORAL_CAPSULE | Freq: Two times a day (BID) | ORAL | 2 refills | Status: DC

## 2015-12-02 NOTE — Progress Notes (Signed)
Amber Harper, is a 30 y.o. female  [id number]  [id number]  DOB - November 20, 1985  Chief Complaint  Patient presents with  . Vaginal Discharge        Subjective:   Amber Harper is a 30 y.o. female here today for a follow up visit, w/ hx of prolactinoma followed by Endo.  Pt here w/ complaints of vaginal discharge, initially described as  Yellow, cheesy consistency, now less so,  ongoing for x 1wk, pt was seen at [facility] clinic about 1 wk ago for same c/o and they gave her 2 pills. Denies pruritis. She thinks it was diflucan when I asked. Of note, she states this is her 4th infection for same recently, so [facility] told her to f/u w/ pcp for further wkup, concern for diabetes mentioned. DM runs in family.  Last menses period ended 2 days ago, last sex intercourse w/ husband last night.   Pt last a1c normal last year.   Patient has No headache, No chest pain, No abdominal pain - No Nausea, No new weakness tingling or numbness, No Cough - SOB.  Requested referral to dentist.  Interpreter was used to communicate directly with patient for the entire encounter including providing detailed patient instructions.   No problems updated.  ALLERGIES: No Known Allergies  PAST MEDICAL HISTORY: Past Medical History:  Diagnosis Date  . Arthritis   . Headache(784.0)     MEDICATIONS AT HOME: Prior to Admission medications   Medication Sig Start Date End Date Taking? Authorizing Provider  amoxicillin (AMOXIL) 500 MG tablet Take 500 mg by mouth 3 (three) times daily. 09/02/15   Historical Provider, MD  b complex vitamins tablet Take 1 tablet by mouth daily.    Historical Provider, MD  cabergoline (DOSTINEX) 0.5 MG tablet 1/2 tablet twice a week 12/31/14   Elayne Snare, MD  folic acid (FOLVITE) A999333 MCG tablet Take 400 mcg by mouth daily.    Historical Provider, MD  saccharomyces boulardii (FLORASTOR) 250 MG capsule Take 1 capsule (250 mg total) by mouth 2 (two)  times daily. 12/02/15   Maren Reamer, MD     Objective:   Vitals:   12/02/15 1155  BP: 96/67  Pulse: (!) 59  Resp: 16  Temp: 98.2 F (36.8 C)  TempSrc: Oral  SpO2: 96%  Weight: 123 lb (55.8 kg)   cma assisted w/ pelvic exam  Exam General appearance : Awake, alert, not in any distress. Speech Clear. Not toxic looking, obese, pleasant HEENT: Atraumatic and Normocephalic, pupils equally reactive to light. Neck: supple, no JVD. Abdomen: Bowel sounds active, Non tender and not distended with no gaurding, rigidity or rebound. Pelvic Exam: Cervix normal in appearance, signs of recent menses noted, external genitalia normal, no adnexal masses or tenderness, no cervical motion tenderness, rectovaginal septum normal, uterus normal size, shape, and consistency and vagina normal with clear white mucous discharge   Extremities: B/L Lower Ext shows no edema, both legs are warm to touch Neurology: Awake alert, and oriented X 3, CN II-XII grossly intact, Non focal Skin:No Rash  Data Review Lab Results  Component Value Date   HGBA1C 5.5 06/12/2014    Depression screen [facility] 2/9 12/02/2015 04/28/2015 08/21/2014 07/10/2012  Decreased Interest 0 0 0 0  Down, Depressed, Hopeless 0 0 0 0  PHQ - 2 Score 0 0 0 0  Altered sleeping - 0 - -  Tired, decreased energy - 0 - -  Change in appetite - 0 - -  Feeling  bad or failure about yourself  - 0 - -  Trouble concentrating - 0 - -  Moving slowly or fidgety/restless - 0 - -  Suicidal thoughts - 0 - -  PHQ-9 Score - 0 - -  Difficult doing work/chores - Not difficult at all - -      Assessment & Plan   1. Vaginitis and vulvovaginitis Suspect BV, will wait on labs.  - probiotics recd, ie Activia yogurt bid, rx florastor provided as well if does not like yogurt, also available otc. - HIV antibody (with reflex) - Cervicovaginal ancillary only  2. Diabetes mellitus screening - HgB A1c 5.4  3. Dental cavities - Ambulatory referral to  Dentistry  4. Prolactinoma  - per endo.   Patient have been counseled extensively about nutrition and exercise  Return in about 3 months (around 03/02/2016), or if symptoms worsen or fail to improve.  The patient was given clear instructions to go to ER or return to medical center if symptoms don't improve, worsen or new problems develop. The patient verbalized understanding. The patient was told to call to get lab results if they haven't heard anything in the next week.   This note has been created with Surveyor, quantity. Any transcriptional errors are unintentional.   Maren Reamer, MD, [facility] [place], [place]   12/02/2015, 12:22 PM

## 2015-12-02 NOTE — Patient Instructions (Signed)
Probiticos (Probiotics) QU SON LOS PROBITICOS? Los probiticos son bacterias y hongos beneficiosos que viven en el [place], y cuidan su salud y la salud del aparato digestivo. Adems, ayudan al sistema de defensa del organismo (inmunitario) y protegen al cuerpo contra la proliferacin de las bacterias nocivas. Ciertos alimentos, como el yogur, contienen probiticos. Los probiticos tambin pueden comprarse como un complemento. Al igual que sucede con cualquier complemento o frmaco, es importante hablar sobre su uso con el mdico. QU COSAS AFECTAN EL EQUILIBRIO DE LAS BACTERIAS EN MI CUERPO? El equilibrio de las bacterias en el cuerpo puede verse afectado por:  Antibiticos. A veces, los antibiticos son necesarios para tratar las infecciones. Lamentablemente, pueden matar, no solo a las bacterias que son nocivas para el organismo sino tambin a aquellas que son beneficiosas o tiles. Esto puede derivar en problemas gstricos, como diarrea, gases y clicos intestinales.  Enfermedades. Algunas enfermedades son el resultado de la proliferacin excesiva de las bacterias nocivas, los hongos o los parsitos. Estos problemas incluyen los siguientes:  Diarrea infecciosa.  Infecciones estomacales y respiratorias.  Infecciones de la piel.  Sndrome del intestino irritable (SII).  Enfermedades inflamatorias del intestino.  lcera debido a infeccin por Helicobacter pylori (H. pylori).  Caries y enfermedad periodontal.  Infecciones vaginales. El estrs y la alimentacin deficiente tambin pueden disminuir las bacterias beneficiosas del organismo. [place] ES ADECUADO PARA M? Los probiticos estn disponibles en farmacias, tiendas de alimentos naturales o tiendas de comestibles de su localidad, y no se requiere Statistician. Vienen en muchas presentaciones, combinaciones de cepas y concentraciones de dosis diferentes. Es posible que algunos deban refrigerarse. Lea siempre la  etiqueta para conocer las instrucciones de uso y Barista. Hay cepas especficas que han demostrado ser ms eficaces para determinadas enfermedades. Pregntele al mdico cul es la opcin ms adecuada para usted. POR QU NECESITARA LOS PROBITICOS? Hay muchas razones [employer] mdico puede recomendarle un complemento de probiticos, entre ellas:  Diarrea.  Estreimiento.  Sndrome del intestino irritable (SII).  Infecciones respiratorias.  Infecciones por hongos.  Acn, eczema y otras enfermedades de la piel.  Infecciones urinarias frecuentes. LOS PROBITICOS, CAUSAN EFECTOS SECUNDARIOS? Algunas personas tienen efectos secundarios leves cuando toman probiticos. Generalmente, estos efectos son temporales y pueden incluir:  Gases.  Meteorismo.  Clicos. En contadas ocasiones, pueden presentarse efectos secundarios graves, como infecciones o cambios a nivel del sistema inmunitario. QU [place] DE LOS PROBITICOS?  Hay muchas cepas diferentes de probiticos. Algunas de ellas pueden ser ms eficaces dependiendo de la enfermedad. Los probiticos estn disponibles en diversas dosis. Consulte al mdico qu probitico debe consumir y con Armed forces operational officer.  Si toma probiticos junto con antibiticos, por lo general se recomienda esperar por lo menos 2horas entre la toma del antibitico y la ingesta del probitico. Personal assistant MS INFORMACIN: [place] Complementaria y Doctor, general practice [facility]), [email or web address] Esta informacin no tiene Marine scientist el consejo del mdico. Asegrese de hacerle al mdico cualquier pregunta que tenga. Document Released: 07/17/2013 Document Revised: 07/17/2013 Document Reviewed: 03/19/2013 Elsevier Interactive Patient Education  2017 [place] (Health Maintenance, Female) Un estilo de vida saludable y  los cuidados preventivos pueden favorecer considerablemente a la salud y Musician. Pregunte a su mdico cul es el cronograma de exmenes peridicos apropiado para usted. Esta es una buena oportunidad para consultarlo sobre cmo prevenir enfermedades y [place] sano. Adems de  los controles, hay muchas otras cosas que puede hacer usted mismo. Los expertos han realizado numerosas investigaciones [employer] cambios en el estilo de vida y las medidas de prevencin que, [place], lo ayudarn a mantenerse sano. Solicite a su mdico ms informacin. EL PESO Y LA DIETA Consuma una dieta saludable.   Asegrese de [employer] verduras, frutas, productos lcteos de bajo contenido de [place] y Advertising account planner.  No consuma muchos alimentos de alto contenido de grasas slidas, azcares agregados o sal.  Realice actividad fsica con regularidad. Esta es una de las prcticas ms importantes que puede hacer por su salud.  La mayora de los adultos deben hacer ejercicio durante al menos 158mnutos por semana. El ejercicio debe aumentar la frecuencia cardaca y pActorla transpiracin (ejercicio de i[place].  La mayora de los adultos tambin deben hField seismologistejercicios de elongacin al mToysRusveces a la semana. Agregue esto al su plan de ejercicio de intensidad moderada. Mantenga un peso saludable.   El ndice de masa corporal ([facility] es una medida que puede utilizarse para identificar posibles problemas de pDale Proporciona una estimacin de la grasa corporal basndose en el peso y la altura. Su mdico puede ayudarle a dRadiation protection practitionerIElk Rivery a lScientist, forensico mTheatre managerun peso saludable.  Para las mujeres de 20aos o ms:  Un IHahnemann University Hospitalmenor de 18,5 se considera bajo peso.  Un ISelect [facility], Incentre 18,5 y 24,9 es normal.  Un ITexan Surgery Centerentre 25 y 29,9 se considera sobrepeso.  Un IMC de 30 o ms se considera obesidad. Observe los niveles de colesterol y lpidos en la sangre.   Debe comenzar a rEnglish as a second language teacher de lpidos y cResearch officer, trade unionen la sangre a los 20aos y luego repetirlos cada 535aos  Es posible que nAutomotive engineerlos niveles de colesterol con mayor frecuencia si:  Sus niveles de lpidos y colesterol son altos.  Es mayor de 50aos.  Presenta un alto riesgo de padecer enfermedades cardacas. DETECCIN DE CNCER Cncer de pulmn   Se recomienda realizar exmenes de deteccin de cncer de pulmn a personas adultas entre 561y 832aos que estn en riesgo de dHorticulturist, commercialde pulmn por sus antecedentes de consumo de tabaco.  Se recomienda una tomografa computarizada de baja dosis de los pLiberty Mediaaos a las personas que:  Fuman actualmente.  Hayan dejado el hbito en algn momento en los ltimos 15aos.  Hayan fumado durante 30aos un paquete diario. Un paquete-ao equivale a fumar un promedio de un paquete de cigarrillos diario durante un ao.  Los exmenes de deteccin anuales deben continuar hasta que hayan pasado 15aos desde que dej de fumar.  Ya no debern realizarse si tiene un problema de salud que le impida recibir tratamiento para eScience writerde pulmn. Cncer de mama   Practique la autoconciencia de la mama. Esto significa reconocer la apariencia normal de sus mamas y cmo las siente.  Tambin significa realizar autoexmenes regulares de lJohnson & Johnson Informe a su mdico sobre cualquier cambio, sin importar cun pequeo sea.  Si tiene entre 20 y 370aos, un mdico debe realizarle un examen clnico de las mamas como parte del examen regular de sM[place] cada 1 a 3aos.  Si tiene 40aos o ms, debe rInformation systems managerclnico de las mMicrosoft Tambin considere realizarse una rLog [place](mBoone[place] todos los aBird-in-Hand  Si tiene antecedentes familiares de cncer de mama, hable con su mdico para someterse a un estudio gentico.  Si tiene  alto riesgo de Chief Financial Officer de mama, hable con su mdico para someterse a Estate manager/land agent y [employer].  La evaluacin del gen del cncer de mama (BRCA) se recomienda a mujeres que tengan familiares con cnceres relacionados con el BRCA. Los cnceres relacionados con el BRCA incluyen los siguientes:  Mama.  Ovario.  Trompas.  Cnceres de peritoneo.  Los resultados de la evaluacin determinarn la necesidad de asesoramiento gentico y de [place] de BRCA1 y BRCA2. Cncer de cuello del tero  El mdico puede recomendarle que se haga pruebas peridicas de deteccin de cncer de los rganos de la pelvis (ovarios, tero y vagina). Estas pruebas incluyen un examen plvico, que abarca controlar si se produjeron cambios microscpicos en la superficie del cuello del tero (prueba de Papanicolaou). Pueden recomendarle que se haga estas pruebas cada 3aos, a partir de los 21aos.  A las mujeres que tienen entre 30 y 66aos, los mdicos pueden recomendarles que se sometan a exmenes plvicos y pruebas de Papanicolaou cada 36aos, o a la prueba de Papanicolaou y el examen plvico en combinacin con estudios de deteccin del virus del papiloma humano (VPH) cada 5aos. Algunos tipos de VPH aumentan el riesgo de Chief Financial Officer de cuello del tero. La prueba para la deteccin del VPH tambin puede realizarse a mujeres de cualquier edad cuyos resultados de la prueba de Papanicolaou no sean claros.  Es posible que otros mdicos no recomienden exmenes de deteccin a mujeres no embarazadas que se consideran sujetos de bajo riesgo de Chief Financial Officer de pelvis y que no tienen sntomas. Pregntele al mdico si un examen plvico de deteccin es adecuado para usted.  Si ha recibido un tratamiento para Science writer cervical o una enfermedad que podra causar cncer, necesitar realizarse una prueba de Papanicolaou y controles durante al menos 58 aos de concluido el [place]. Si no se ha hecho el Papanicolaou con regularidad, debern volver a evaluarse los factores de riesgo  (como tener un nuevo compaero sexual), para Teacher, adult education si debe realizarse los estudios nuevamente. Algunas mujeres sufren problemas mdicos que aumentan la probabilidad de Museum/gallery curator cncer de cuello del tero. En estos casos, el mdico podr [employer]ALCOMM se realicen controles y pruebas de Papanicolaou con ms frecuencia. Cncer colorrectal   Este tipo de cncer puede detectarse y a menudo prevenirse.  Por lo general, los estudios de rutina se deben Medical laboratory scientific officer a Field seismologist a Proofreader de los 11 aos y [place] 75 aos.  Sin embargo, el mdico podr aconsejarle que lo haga antes, si tiene factores de riesgo para el cncer de colon.  Tambin puede recomendarle que use un kit de prueba para Hydrologist en la materia fecal.  Es posible que se use una pequea cmara en el extremo de un tubo para examinar directamente el colon (sigmoidoscopia o colonoscopia) a fin de Hydrographic surveyor formas tempranas de cncer colorrectal.  Los exmenes de rutina generalmente comienzan a los 16aos.  El examen directo del colon se debe repetir cada 5 a 10aos hasta los 75aos. Sin embargo, es posible que se realicen exmenes con mayor frecuencia, si se detectan formas tempranas de plipos precancerosos o pequeos bultos. Cncer de piel   Revise la piel de la cabeza a los pies con regularidad.  Informe a su mdico si aparecen nuevos lunares o los que tiene se modifican, especialmente en su forma y color.  Tambin notifique al mdico si tiene un lunar que es ms grande que el tamao de una goma de  lpiz.  Andee Lineman use pantalla solar. Aplique pantalla solar de Kerry Dory y repetida a lo largo del Training and development officer.  Protjase usando mangas y [employer], un sombrero de ala ancha y gafas para el sol, siempre que se encuentre en el exterior. ENFERMEDADES CARDACAS, DIABETES E HIPERTENSIN ARTERIAL  La hipertensin arterial causa enfermedades cardacas y [place] el riesgo de ictus. La hipertensin arterial es ms probable en los  siguientes casos:  Las personas que tienen la presin arterial en el extremo del rango normal (100-139/85-89 mm Hg).  Las personas con sobrepeso u obesidad.  Las Retail banker.  Si usted tiene entre 18 y 39 aos, debe medirse la presin arterial cada 3 a 5 aos. Si usted tiene 40 aos o ms, debe medirse la presin arterial [employer]. Debe medirse la presin arterial dos veces: una vez cuando est en un hospital o una clnica y la otra vez cuando est en otro sitio. Registre el promedio de [employer]. Para controlar su presin arterial cuando no est en un hospital o [place], puede usar lo siguiente:  Ardelia Mems mquina automtica para medir la presin arterial en una farmacia.  Un monitor para medir la presin arterial en el hogar.  Si tiene entre 27 y 75 aos, consulte a su mdico si debe tomar aspirina para prevenir el ictus.  Realcese exmenes de deteccin de la diabetes con regularidad. Esto incluye la toma de [place] de sangre para controlar el nivel de azcar en la sangre durante el [place].  Si tiene un peso normal y un bajo riesgo de padecer diabetes, realcese este anlisis cada tres aos despus de los 45aos.  Si tiene sobrepeso y un alto riesgo de padecer diabetes, considere someterse a este anlisis antes o con mayor frecuencia. PREVENCIN DE INFECCIONES HepatitisB   Si tiene un riesgo ms alto de Museum/gallery curator hepatitis B, debe someterse a un examen de deteccin de este virus. Se considera que tiene un alto riesgo de Museum/gallery curator hepatitis B si:  Naci en un pas donde la hepatitis B es frecuente. Pregntele a su mdico qu pases son considerados de Public affairs consultant.  Sus padres nacieron en un pas de alto riesgo y usted no recibi una vacuna que lo proteja contra la hepatitis B (vacuna contra la hepatitis B).  [place].  [place] agujas para inyectarse drogas.  Vive con alguien que tiene hepatitis B.  Ha tenido sexo con alguien que tiene hepatitis  B.  Recibe tratamiento de hemodilisis.  Toma ciertos medicamentos para el cncer, trasplante de rganos y afecciones autoinmunitarias. Hepatitis C   Se recomienda un anlisis de [place] para:  Todos los que nacieron entre 1945 y [phone].  Todas las personas que tengan un riesgo de haber contrado hepatitis C. Enfermedades de transmisin sexual (ETS).   Debe realizarse pruebas de deteccin de enfermedades de transmisin sexual (ETS), incluidas gonorrea y clamidia si:  Es sexualmente activo y es menor de 24aos.  Es mayor de 24aos, y Investment banker, operational informa que corre riesgo de tener este tipo de infecciones.  La actividad sexual ha cambiado desde que le hicieron la ltima prueba de deteccin y tiene un riesgo mayor de Best boy clamidia o Radio broadcast assistant. Pregntele al mdico si usted tiene riesgo.  Si no tiene el VIH, pero corre riesgo de infectarse por el virus, se recomienda tomar diariamente un medicamento recetado para evitar la infeccin. Esto se conoce como profilaxis previa a la exposicin. Se considera que est en riesgo si:  Es [place]  sexualmente y no [place] preservativos habitualmente o no conoce el estado del VIH de sus parejas sexuales.  Se inyecta drogas.  Es [place] sexualmente con Ardelia Mems pareja que tiene VIH. Consulte a su mdico para saber si tiene un alto riesgo de infectarse por el VIH. Si opta por comenzar la profilaxis previa a la exposicin, primero debe realizarse anlisis de deteccin del VIH. Luego, le harn anlisis cada 59mses mientras est tomando los medicamentos para la profilaxis previa a la exposicin. [facility] Si es premenopusica y puede quedar eStreator solicite a su mdico asesoramiento previo a la concepcin.  Si puede quedar embarazada, tome 400 a [id number](mcg) de cido fAnheuser-Busch  Si desea evitar el embarazo, hable con su mdico sobre el control de la natalidad (anticoncepcin). OSTEOPOROSIS Y MENOPAUSIA  La osteoporosis es una enfermedad  en la que los huesos pierden los minerales y la fuerza por el avance de la edad. El resultado pueden ser fracturas graves en los hM[place] El riesgo de osteoporosis puede identificarse con uArdelia Memsprueba de densidad sea.  Si tiene 65aos o ms, o si est en riesgo de sufrir osteoporosis y fracturas, pregunte a su mdico si debe someterse a exmenes.  Consulte a su mdico si debe tomar un suplemento de calcio o de vitamina D para reducir el riesgo de osteoporosis.  La menopausia puede presentar ciertos sntomas fsicos y rGaffer  La terapia de reemplazo hormonal puede reducir algunos de estos sntomas y rGaffer Consulte a su mdico para saber si la terapia de reemplazo hormonal es conveniente para usted. INSTRUCCIONES PARA EL CUIDADO EN EL HOGAR  Realcese los estudios de rutina de la salud, dentales y de lPublic librarian  MMar[place]  No consuma ningn producto que contenga tabaco, lo que incluye cigarrillos, tabaco de mHigher education careers advisero cPsychologist, sport and exercise  Si est embarazada, no beba alcohol.  Si est amamantando, reduzca el consumo de alcohol y la frecuencia con la que consume.  Si es mujer y no est embarazada limite el consumo de alcohol a no ms de 1 medida por da. Una medida equivale a 12onzas de cerveza, 5onzas de vino o 1onzas de bebidas alcohlicas de alta graduacin.  No consuma drogas.  No comparta agujas.  Solicite ayuda a su mdico si necesita apoyo o informacin para abandonar las drogas.  Informe a su mdico si a menudo se siente deprimido.  Notifique a su mdico si alguna vez ha sido vctima de abuso o si no se siente seguro en su hogar. Esta informacin no tiene cMarine scientistel consejo del mdico. Asegrese de hacerle al mdico cualquier pregunta que tenga. Document Released: 12/09/2010 Document Revised: 01/10/2014 Document Reviewed: 09/23/2014 Elsevier Interactive Patient Education  2017 [employer]

## 2015-12-03 LAB — CERVICOVAGINAL ANCILLARY ONLY
Chlamydia: NEGATIVE
Neisseria Gonorrhea: NEGATIVE
Wet Prep (BD Affirm): NEGATIVE

## 2015-12-03 LAB — HIV ANTIBODY (ROUTINE TESTING W REFLEX): HIV 1&2 Ab, 4th Generation: NONREACTIVE

## 2015-12-16 ENCOUNTER — Telehealth: Payer: Self-pay

## 2015-12-16 NOTE — Telephone Encounter (Signed)
[employer] [place] Id: [id number] Contacted pt to go over lab results lvm asking pt to give me a call back at her earliest convenience

## 2015-12-17 ENCOUNTER — Telehealth: Payer: Self-pay | Admitting: Internal Medicine

## 2015-12-17 ENCOUNTER — Telehealth: Payer: Self-pay

## 2015-12-17 ENCOUNTER — Other Ambulatory Visit: Payer: Self-pay

## 2015-12-17 DIAGNOSIS — D352 Benign neoplasm of pituitary gland: Secondary | ICD-10-CM

## 2015-12-17 NOTE — Telephone Encounter (Signed)
Pt is aware of results. 

## 2015-12-17 NOTE — Telephone Encounter (Signed)
Patient would like lab results.

## 2015-12-17 NOTE — Telephone Encounter (Signed)
[employer] [place] Id: [id number] Contacted pt to go over lab results pt is aware of results and doesn't have any questions or concerns

## 2015-12-18 LAB — PROLACTIN: Prolactin: 105.3 ng/mL — ABNORMAL HIGH (ref 4.8–23.3)

## 2015-12-22 ENCOUNTER — Encounter: Payer: Self-pay | Admitting: Endocrinology

## 2015-12-22 ENCOUNTER — Ambulatory Visit (INDEPENDENT_AMBULATORY_CARE_PROVIDER_SITE_OTHER): Payer: Self-pay | Admitting: Endocrinology

## 2015-12-22 VITALS — BP 102/60 | HR 86 | Ht <= 58 in | Wt 125.0 lb

## 2015-12-22 DIAGNOSIS — D352 Benign neoplasm of pituitary gland: Secondary | ICD-10-CM

## 2015-12-22 MED ORDER — CABERGOLINE 0.5 MG PO TABS: ORAL_TABLET | ORAL | 1 refills | Status: DC

## 2015-12-22 NOTE — Progress Notes (Signed)
Amber Harper 30 y.o.             Chief complaint: Followup of prolactinoma   History of Present Illness:   She  had amenorrhea since 4/14 after stopping her birth control pill  Also since her last childbirth she had some degree of galactorrhea, mostly on expressing milk from her breasts. She was evaluated for above symptoms and found to have an elevated prolactin level of 227 in 11/2012. 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  She was started on Dostinex on 02/12/13 and had been taking a half tablet twice a week She did have nausea and little dizziness with the full tablet previously  Menses have been regular since early April 2015 each month Also does not have any galactorrhea   Since her prolactin level had been low normal consistently her Dostinex half tablet weekly was stopped in 10/16; however with her prolactin level going up to 73 this was  restarted in 2/17 Apparently she had a second opinion at [facility] and MRI was done which 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  Her prolactin level was below normal in 6/17 and she was told to reduce her Dostinex to once a week, half tablet She had a normal prolactin of 12.6 subsequently  At this point she was told to take the Dostinex for 2 more months and then stop She has not had any changes in her menstrual cycles, no headaches and no galactorrhea She also said that after she takes her medication she will feel tired and weak for a couple of days  Prolactin is much higher now   Lab Results  Component Value Date   PROLACTIN 105.3 (H) 12/17/2015   PROLACTIN 12.6 09/01/2015   PROLACTIN 4.5 (L) 07/01/2015   PROLACTIN 5.3 04/28/2015      Allergies as of 12/22/2015   No Known Allergies     Medication List       Accurate as of 12/22/15 10:55 AM. Always use your most recent med list.          b complex vitamins tablet Take 1 tablet by mouth daily.   cabergoline 0.5 MG  tablet Commonly known as:  DOSTINEX 1/2 tablet twice a week   folic acid [id number] MCG tablet Commonly known as:  FOLVITE Take 400 mcg by mouth daily.   saccharomyces boulardii 250 MG capsule Commonly known as:  FLORASTOR Take 1 capsule (250 mg total) by mouth 2 (two) times daily.       Allergies: No Known Allergies  Past Medical History:  Diagnosis Date  . Arthritis   . [id number])     Past Surgical History:  Procedure Laterality Date  . CESAREAN SECTION    . MASS EXCISION Left 09/14/2012   Procedure: EXCISION MASS;  Surgeon: Ruby Cola, MD;  [facility];  Service: ENT;  Laterality: Left;  removal of tongue mass    Family History  Problem Relation Age of Onset  . Diabetes Mother   . Diabetes Maternal Grandmother   . Diabetes Paternal Grandmother     Social History:  reports that she has never smoked. She has never used smokeless tobacco. She reports that she does not drink alcohol or use drugs.       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  She had excellent control of her hyperprolactinemia with only half tablet weekly of her Dostinex Now with not taking this for about 6 weeks for prolactin level has jumped up significantly to 105 Currently has not been symptomatic as yet with normal menstrual cycle the last 2 months  She thinks she can read other year to get pregnant Although she thinks she gets a little tired or weak with taking her Dostinex she agrees to take this again, she may have similar side effects with bromocriptine which will need to be taking daily  She will go back to her previous dose of half tablet once a week and follow up in 3 months   Patient Instructions  Restart 1/2 tab  weekly    [facility] 12/22/2015, 10:55 AM   Note: This office note was prepared with Estate agent. Any transcriptional errors that result from this process are unintentional.

## 2015-12-22 NOTE — Patient Instructions (Signed)
Restart 1/2 tab weekly

## 2016-03-01 MED FILL — CABERGOLINE 0.5 MG TABLET: 0.5 | 30 days supply | Qty: 8 | Fill #0

## 2016-03-04 ENCOUNTER — Ambulatory Visit: Payer: Self-pay | Attending: Internal Medicine

## 2016-03-08 ENCOUNTER — Ambulatory Visit: Payer: Self-pay

## 2016-03-18 ENCOUNTER — Other Ambulatory Visit: Payer: Self-pay

## 2016-03-18 DIAGNOSIS — D352 Benign neoplasm of pituitary gland: Secondary | ICD-10-CM

## 2016-03-19 LAB — PROLACTIN: Prolactin: 13.4 ng/mL (ref 4.8–23.3)

## 2016-03-20 NOTE — Progress Notes (Signed)
Amber Harper 31 y.o.             Chief complaint: Followup of prolactinoma   History of Present Illness:   She  had amenorrhea since 4/14 after stopping her birth control pill  Also since her last childbirth she had some degree of galactorrhea, mostly on expressing milk from her breasts. She was evaluated for above symptoms and found to have an elevated prolactin level of 227 in 11/2012. 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  She was started on Dostinex on 02/12/13 and had been taking a half tablet twice a week She did have nausea and little dizziness with the full tablet previously  Menses have been regular since early April 2015 each month Also does not have any galactorrhea   Since her prolactin level had been low normal consistently her Dostinex half tablet weekly was stopped in 10/16; however with her prolactin level going up to 73 this was  restarted in 2/17 Apparently she had a second opinion at [facility] and MRI was done which 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  Her prolactin level was below normal in 6/17 and she was told to reduce her Dostinex to once a week, half tablet She had a normal prolactin of 12.6 subsequently  At this point she was told to take the Dostinex for 2 more months and then stop Prolactin much higher when she stopped the medication and was 105 in 12/17  She has continued to have regular menstrual cycles and no headaches and no galactorrhea  She currently does not get any symptoms of fatigue or weakness after taking the medication  She is now taking the medication half tablet twice a week on Mondays and Thursdays However she said that she started this only about a month ago since she was not able to get this done prescribed in December    Lab Results  Component Value Date   PROLACTIN 13.4 03/18/2016   PROLACTIN 105.3 (H) 12/17/2015   PROLACTIN 12.6 09/01/2015   PROLACTIN 4.5 (L) 07/01/2015      Allergies as of 03/21/2016   No Known Allergies     Medication List       Accurate as of 03/21/16  8:10 AM. Always use your most recent med list.          b complex vitamins tablet Take 1 tablet by mouth daily.   cabergoline 0.5 MG tablet Commonly known as:  DOSTINEX 1/2 tablet twice a week   folic acid 720 MCG tablet Commonly known as:  FOLVITE Take 400 mcg by mouth daily.   saccharomyces boulardii 250 MG capsule Commonly known as:  FLORASTOR Take 1 capsule (250 mg total) by mouth 2 (two) times daily.       Allergies: No Known Allergies  Past Medical History:  Diagnosis Date  . Arthritis   . NOBSJGGE(366.2)     Past Surgical History:  Procedure Laterality Date  . CESAREAN SECTION    . MASS EXCISION Left 09/14/2012   Procedure: EXCISION MASS;  Surgeon: Ruby Cola, MD;  Location: [facility] OR;  Service: ENT;  Laterality: Left;  removal of tongue mass    Family History  Problem Relation Age of Onset  . Diabetes Mother   . Diabetes Maternal Grandmother   . Diabetes Paternal Grandmother     Social History:  reports that she has never smoked. She has never used smokeless tobacco. She reports that she does not drink alcohol  or use drugs.    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  She has had excellent control of her hyperprolactinemia with cutting back on cabergoline, now taking half tablet twice a week since her level was significantly higher in December Menstrual cycle has been regular Again she is agreeing to wait until at least the end of the year to plan a pregnancy  For now continue the same dose of half tablet twice a week and  follow-up in 3 months May consider reducing the dose at that time if prolactin low normal   There are no Patient Instructions on file for this visit.   Zyrion Coey 03/21/2016, 8:10 AM   Note: This office note was prepared with Dragon voice recognition system technology. Any transcriptional errors that result from this process are unintentional.

## 2016-03-21 ENCOUNTER — Encounter: Payer: Self-pay | Admitting: Endocrinology

## 2016-03-21 ENCOUNTER — Ambulatory Visit (INDEPENDENT_AMBULATORY_CARE_PROVIDER_SITE_OTHER): Payer: Self-pay | Admitting: Endocrinology

## 2016-03-21 VITALS — BP 104/66 | HR 62 | Ht <= 58 in | Wt 127.0 lb

## 2016-03-21 DIAGNOSIS — D352 Benign neoplasm of pituitary gland: Secondary | ICD-10-CM

## 2016-03-21 NOTE — Patient Instructions (Signed)
Same dose 

## 2016-04-19 MED FILL — BROMOCRIPTINE 2.5 MG TABLET: 2.5 | 60 days supply | Qty: 30 | Fill #0

## 2016-04-29 ENCOUNTER — Encounter (HOSPITAL_COMMUNITY): Payer: Self-pay

## 2016-04-29 ENCOUNTER — Ambulatory Visit (HOSPITAL_COMMUNITY)
Admission: EM | Admit: 2016-04-29 | Discharge: 2016-04-29 | Disposition: A | Discharge status: Home / Self Care | Payer: Self-pay

## 2016-04-29 DIAGNOSIS — J302 Other seasonal allergic rhinitis: Secondary | ICD-10-CM

## 2016-04-29 DIAGNOSIS — J029 Acute pharyngitis, unspecified: Secondary | ICD-10-CM

## 2016-04-29 MED ORDER — PREDNISONE 10 MG (21) PO TBPK: ORAL_TABLET | Freq: Every day | ORAL | 0 refills | Status: DC

## 2016-04-29 MED ORDER — LORATADINE 10 MG PO TABS: 10.0000 mg | ORAL_TABLET | Freq: Every day | ORAL | 0 refills | Status: DC

## 2016-04-29 MED FILL — predniSONE 10 MG TABS: 10 | 12 days supply | Qty: 42 | Fill #0

## 2016-04-29 NOTE — ED Triage Notes (Signed)
Pt has a cold for 1 week. Sore throat, left ear pain, cough and runny nose. Unsure of fever. Taking robitussin.

## 2016-04-29 NOTE — ED Provider Notes (Signed)
CSN: [id number]     Arrival date & time 04/29/16  1143 History   First MD Initiated Contact with Patient 04/29/16 1200     Chief Complaint  Patient presents with  . Sore Throat   (Consider location/radiation/quality/duration/timing/severity/associated sxs/prior Treatment) Cough, congestion, sore throat, for the past week. Denies any fever, no n/v/d. Denies any hx of season allergies. Has not taken anything pta. Denies any blurred vision, headaches.       Past Medical History:  Diagnosis Date  . Arthritis   . ELFYBOFB(510.2)    Past Surgical History:  Procedure Laterality Date  . CESAREAN SECTION    . MASS EXCISION Left 09/14/2012   Procedure: EXCISION MASS;  Surgeon: Ruby Cola, MD;  Location: [facility] OR;  Service: ENT;  Laterality: Left;  removal of tongue mass   Family History  Problem Relation Age of Onset  . Diabetes Mother   . Diabetes Maternal Grandmother   . Diabetes Paternal Grandmother    Social History  Substance Use Topics  . Smoking status: Never Smoker  . Smokeless tobacco: Never Used  . Alcohol use No   OB History    No data available     Review of Systems  Constitutional: Negative.   HENT: Positive for congestion, postnasal drip, rhinorrhea and sore throat.   Eyes: Negative.   Respiratory: Positive for cough.   Cardiovascular: Negative.   Gastrointestinal: Negative.   Neurological: Negative.     Allergies  Patient has no known allergies.  Home Medications   Prior to Admission medications   Medication Sig Start Date End Date Taking? Authorizing Provider  bromocriptine (PARLODEL) 2.5 MG tablet Take by mouth daily.   Yes Historical Provider, MD  b complex vitamins tablet Take 1 tablet by mouth daily.    Historical Provider, MD  cabergoline (DOSTINEX) 0.5 MG tablet 1/2 tablet twice a week 12/22/15   Elayne Snare, MD  folic acid (FOLVITE) 585 MCG tablet Take 400 mcg by mouth daily.    Historical Provider, MD  loratadine (CLARITIN) 10 MG tablet Take 1  tablet (10 mg total) by mouth daily. 04/29/16   Melanee Left, NP  predniSONE (STERAPRED UNI-PAK 21 TAB) 10 MG (21) TBPK tablet Take by mouth daily. Take 6 tabs by mouth daily  for 2 days, then 5 tabs for 2 days, then 4 tabs for 2 days, then 3 tabs for 2 days, 2 tabs for 2 days, then 1 tab by mouth daily for 2 days 04/29/16   Melanee Left, NP  saccharomyces boulardii (FLORASTOR) 250 MG capsule Take 1 capsule (250 mg total) by mouth 2 (two) times daily. 12/02/15   Maren Reamer, MD   Meds Ordered and Administered this Visit  Medications - No data to display  BP 105/68 (BP Location: Left Arm)   Pulse 64   Temp 98.3 F (36.8 C) (Oral)   Resp 20   LMP 04/15/2016 (Exact Date)   SpO2 97%  No data found.   Physical Exam  Constitutional: She appears well-developed.  HENT:  Head: Normocephalic.  Eyes: Pupils are equal, round, and reactive to light.  Neck: Normal range of motion.  Cardiovascular: Normal rate.   Pulmonary/Chest: Effort normal.  Neurological: She is alert.  Skin: Skin is warm.    Urgent Care Course     Procedures (including critical care time)  Labs Review Labs Reviewed - No data to display  Imaging Review No results found.  MDM   1. Seasonal allergic rhinitis, unspecified trigger   2. Sore throat    Will need to follow up with pcp  The Claritin you can take daily and may need to continue daily May use a humidifier at night to help Drink plenty of fluids        Melanee Left, NP 04/29/16 1232

## 2016-05-19 ENCOUNTER — Encounter: Payer: Self-pay | Admitting: Internal Medicine

## 2016-06-06 ENCOUNTER — Encounter (HOSPITAL_COMMUNITY): Payer: Self-pay | Admitting: Emergency Medicine

## 2016-06-06 ENCOUNTER — Ambulatory Visit (HOSPITAL_COMMUNITY)
Admission: EM | Admit: 2016-06-06 | Discharge: 2016-06-06 | Disposition: A | Discharge status: Home / Self Care | Payer: Self-pay | Attending: Emergency Medicine | Admitting: Emergency Medicine

## 2016-06-06 DIAGNOSIS — L03031 Cellulitis of right toe: Secondary | ICD-10-CM

## 2016-06-06 MED ORDER — CEPHALEXIN 500 MG PO CAPS: 500.0000 mg | ORAL_CAPSULE | Freq: Three times a day (TID) | ORAL | 0 refills | Status: AC

## 2016-06-06 MED ORDER — CEPHALEXIN 500 MG PO CAPS: 500.0000 mg | ORAL_CAPSULE | Freq: Three times a day (TID) | ORAL | 0 refills | Status: DC

## 2016-06-06 MED FILL — CEPHALEXIN 500 MG CAPSULE: 500 | 7 days supply | Qty: 21 | Fill #0

## 2016-06-06 NOTE — ED Triage Notes (Signed)
The patient presented to the [facility] with a swollen and sore 3rd toe on her right foot x 1 week. The patient reported that it started after she trimmed her toe nail.

## 2016-06-06 NOTE — ED Provider Notes (Signed)
CSN: [id number]     Arrival date & time 06/06/16  1459 History   First MD Initiated Contact with Patient 06/06/16 1709     Chief Complaint  Patient presents with  . Toe Pain   (Consider location/radiation/quality/duration/timing/severity/associated sxs/prior Treatment) 31 year old female presents with right 3rd toe pain for about 1 week. She cut her toenails about a week ago and started experiencing more irritation and swelling in the toe shortly after. Now toe is red, swollen and painful on medial aspect of nail as well as lateral. Slight dried blood present but no discharge now. She has been trying to soak toe and applying triple antibiotic ointment with no relief. Currently taking Prenatal vitamins and Parlodel for Prolactinoma.    The history is provided by the patient.    Past Medical History:  Diagnosis Date  . Arthritis   . VEHMCNOB(096.2)    Past Surgical History:  Procedure Laterality Date  . CESAREAN SECTION    . MASS EXCISION Left 09/14/2012   Procedure: EXCISION MASS;  Surgeon: Ruby Cola, MD;  Location: [facility] OR;  Service: ENT;  Laterality: Left;  removal of tongue mass   Family History  Problem Relation Age of Onset  . Diabetes Mother   . Diabetes Maternal Grandmother   . Diabetes Paternal Grandmother    Social History  Substance Use Topics  . Smoking status: Never Smoker  . Smokeless tobacco: Never Used  . Alcohol use No   OB History    No data available     Review of Systems  Constitutional: Negative for activity change, chills, fatigue and fever.  Cardiovascular: Negative for leg swelling.  Gastrointestinal: Negative for nausea and vomiting.  Musculoskeletal: Positive for arthralgias and joint swelling. Negative for back pain, gait problem and myalgias.  Skin: Positive for color change and wound. Negative for rash.  Neurological: Negative for dizziness, seizures, syncope, weakness, numbness and headaches.  Hematological: Negative for adenopathy. Does not  bruise/bleed easily.    Allergies  Patient has no known allergies.  Home Medications   Prior to Admission medications   Medication Sig Start Date End Date Taking? Authorizing Provider  bromocriptine (PARLODEL) 2.5 MG tablet Take by mouth daily.   Yes [provider]  Prenatal Vit-Fe Fumarate-FA (PRENATAL MULTIVITAMIN) TABS tablet Take 1 tablet by mouth daily at 12 noon.   Yes [provider]  cephALEXin (KEFLEX) 500 MG capsule Take 1 capsule (500 mg total) by mouth 3 (three) times daily. 06/06/16 06/13/16  Katy Apo, NP   Meds Ordered and Administered this Visit  Medications - No data to display  BP (!) 104/59 (BP Location: Right Arm)   Pulse 63   Temp 98.2 F (36.8 C) (Oral)   Resp 18   SpO2 99%  No data found.   Physical Exam  Constitutional: She is oriented to person, place, and time. She appears well-developed and well-nourished. No distress.  HENT:  Head: Normocephalic and atraumatic.  Right Ear: External ear normal.  Left Ear: External ear normal.  Mouth/Throat: Oropharynx is clear and moist.  Eyes: Conjunctivae and EOM are normal.  Neck: Normal range of motion.  Cardiovascular: Normal rate and regular rhythm.   Pulmonary/Chest: Effort normal.  Musculoskeletal: Normal range of motion. She exhibits tenderness.       Right foot: There is tenderness and swelling. There is normal range of motion, normal capillary refill, no crepitus, no deformity and no laceration.       Feet:  Red, swollen 3rd  toe on right foot. Very tender to palpation. Slight dried blood present on medial aspect of toenail. Skin irritated on 2nd lateral toe from nail on 3rd toe. Good pulses and capillary refill. No neuro deficits noted. Toe nail was cut appropriately- no distinct ingrown nail present.     Neurological: She is alert and oriented to person, place, and time. She has normal strength. No sensory deficit.  Skin: Skin is warm and dry. There is erythema.  Psychiatric:  She has a normal mood and affect. Her behavior is normal. Judgment and thought content normal.    Urgent Care Course     Procedures (including critical care time)  Labs Review Labs Reviewed - No data to display  Imaging Review No results found.   Visual Acuity Review  Right Eye Distance:   Left Eye Distance:   Bilateral Distance:    Right Eye Near:   Left Eye Near:    Bilateral Near:         MDM   1. Cellulitis of toe of right foot   2. Paronychia of third toe of right foot    Discussed with patient that she has a skin infection. Applied triple antibiotic ointment and covered with gauze. Recommend start Keflex 3 times a day as directed. Continue to soak foot in warm water to help with any drainage and for comfort. May take Ibuprofen 600mg  every 6 hours as needed for pain. Recommend follow-up with her primary care provider in 2 to 3 days if not improving.     Katy Apo, NP 06/07/16 [phone]

## 2016-06-06 NOTE — Discharge Instructions (Signed)
Start Keflex (antibiotic) 3 times a day as directed. Keep soaking foot in warm water to help with any drainage and for comfort. May take Ibuprofen 600mg  every 6 hours as needed for pain. Follow-up in 2 to 3 days with your primary care provider if redness, swelling and pain do not improve.

## 2016-06-09 MED FILL — BROMOCRIPTINE 2.5 MG TABLET: 2.5 | 60 days supply | Qty: 30 | Fill #1

## 2016-06-13 ENCOUNTER — Other Ambulatory Visit: Payer: Self-pay

## 2016-06-17 ENCOUNTER — Other Ambulatory Visit: Payer: Self-pay

## 2016-06-17 LAB — CYTOLOGY - PAP: Diagnosis: NEGATIVE

## 2016-06-21 ENCOUNTER — Ambulatory Visit: Payer: Self-pay | Admitting: Endocrinology

## 2016-07-18 ENCOUNTER — Encounter (HOSPITAL_COMMUNITY): Payer: Self-pay | Admitting: *Deleted

## 2016-08-12 ENCOUNTER — Ambulatory Visit: Payer: Self-pay | Attending: Internal Medicine

## 2016-08-16 ENCOUNTER — Encounter: Payer: Self-pay | Admitting: Student

## 2016-08-16 ENCOUNTER — Ambulatory Visit (INDEPENDENT_AMBULATORY_CARE_PROVIDER_SITE_OTHER): Payer: Self-pay | Admitting: Student

## 2016-08-16 VITALS — BP 104/59 | HR 60 | Wt 133.1 lb

## 2016-08-16 DIAGNOSIS — Z3480 Encounter for supervision of other normal pregnancy, unspecified trimester: Secondary | ICD-10-CM

## 2016-08-16 DIAGNOSIS — Z113 Encounter for screening for infections with a predominantly sexual mode of transmission: Secondary | ICD-10-CM

## 2016-08-16 DIAGNOSIS — O0991 Supervision of high risk pregnancy, unspecified, first trimester: Secondary | ICD-10-CM

## 2016-08-16 DIAGNOSIS — O0993 Supervision of high risk pregnancy, unspecified, third trimester: Secondary | ICD-10-CM | POA: Insufficient documentation

## 2016-08-16 MED ORDER — PRENATAL VITAMINS 0.8 MG PO TABS: 1.0000 | ORAL_TABLET | Freq: Every day | ORAL | 12 refills | Status: AC

## 2016-08-16 MED ORDER — TERCONAZOLE 0.4 % VA CREA: 1.0000 | TOPICAL_CREAM | Freq: Every day | VAGINAL | 0 refills | Status: DC

## 2016-08-16 NOTE — Progress Notes (Signed)
  Subjective:    Amber Harper is being seen today for her first obstetrical visit.  This is a planned pregnancy. She is at [redacted]w[redacted]d gestation. Her obstetrical history is significant for prolactinoma, being followed at [facility]. Relationship with FOB: spouse, living together. Patient does intend to breast feed. Pregnancy history fully reviewed.  Patient reports vaginal irritation. Patients vaginal itching and discharge for the past week.  She denies headache or visual changes.  Review of Systems:   Review of Systems  Respiratory: Negative.   Cardiovascular: Negative.   Gastrointestinal: Negative.   Genitourinary: Negative.   Musculoskeletal: Negative.   Psychiatric/Behavioral: Negative.     Objective:     BP (!) 104/59   Pulse 60   Wt 133 lb 1.6 oz (60.4 kg)   LMP 06/15/2016 (LMP Unknown)   BMI 32.09 kg/m  Physical Exam  Constitutional: She is oriented to person, place, and time. She appears well-developed and well-nourished.  HENT:  Head: Normocephalic.  Neck: Normal range of motion.  Respiratory: Effort normal.  GI: Soft.  Genitourinary: Vagina normal.  Genitourinary Comments: NEFG; no lesions on vaginal walls or on cervix. Clumpy white discharge in the vagina.   Musculoskeletal: Normal range of motion.  Neurological: She is alert and oriented to person, place, and time.  Skin: Skin is warm and dry.  Psychiatric: She has a normal mood and affect.    Exam    Assessment:    Pregnancy: [id number] Patient Active Problem List   Diagnosis Date Noted  . Lipoma of abdominal wall 09/24/2014  . Hemorrhoid 03/22/2013  . Prolactinoma ([place]) 02/11/2013  . Varicocele 07/10/2012  . 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 Constipation 07/04/2012       Plan:     Initial labs drawn. Prenatal vitamins. Problem list reviewed and updated. AFP3 discussed: will do 1st trimester screen. Role of ultrasound in pregnancy discussed; fetal survey: Will order next visit. Amniocentesis  discussed: not indicated. Follow up in 4 weeks. 50% of 30 min visit spent on counseling and coordination of care.    Patient is being followed by Endocrinologist at [facility]. She states that she had a follow-up appointment with them  on July 31st, and that they know she is pregnant. They took her off of her bromocriptine in June, and she will repeat her labs in October. Her prolactin level on 08-02-2016 was 97 (elevated, however Up to Date says that levels below 400 are considered normal in pregnancy). She is waiting to find out if they want her to start any type of medication based on her labs from 08-02-2016.   Patient is very knowledgeable about her health history and is able to make her endocrinology appointments. I told her that if transportation became an issue she could transfer to our clinic here, but that since she is established there, she should stay with [facility] for her endocrinology.    Mervyn Skeeters [facility] 08/16/2016

## 2016-08-16 NOTE — Patient Instructions (Signed)
Crecimiento del beb durante Water quality scientist (How a Baby Grows During Pregnancy) El embarazo comienza cuando el semen de un hombre ingresa al vulo de una mujer (fecundacin). Esto ocurre en una de las trompas de [place] que Exelon Corporation ovarios con el tero. Al vulo fecundado se lo denomina embrin hasta que Safeway Inc. A partir de las 10semanas y [place] momento del parto, se llama feto. El vulo fecundado se desplaza por la trompa de [employer] llegar al tero y luego se implanta en el endometrio y Paediatric nurse. El feto en crecimiento recibe oxgeno y nutrientes a travs del torrente sanguneo de la embarazada y de los tejidos que se forman (placenta) para la sustentacin fetal. La placenta es el sistema de sustentacin de la vida del feto, proporciona la nutricin y Costco Wholesale. Informarse tanto como pueda sobre el embarazo y la forma en que se desarrolla el beb puede ayudarla a disfrutar de la experiencia, y, adems, a que se d cuenta de cundo puede haber un problema y cundo hacer preguntas. CUNTO DURA UN EMBARAZO NORMAL? Generalmente, el embarazo dura 280das, o unas 40semanas. Se divide tres trimestres:  Primer trimestre: desde la semana0 a [id number].  Segundo trimestre: desde la [id number] a la27.  Tercer trimestre: [place] [id number] a la40. El da que se considera que el beb est listo para Associate Professor (a trmino) es la fecha prevista de [place]. CMO SE DESARROLLA EL BEB MES A MES? Primer mes  El vulo fecundado se implanta dentro del tero.  Algunas clulas formarn la placenta, y otras formarn el feto.  Empiezan a Marriott, las piernas, la mdula [place], los pulmones y Film/video editor.  Al final del primer mes, el corazn comienza a Engineering geologist. Segundo mes  Se forman los [place], el odo interno, los prpados, las manos y Raytheon.  Se desarrollan los genitales.  Al final de las 8semanas, todos los rganos importantes estn en  desarrollo. Tercer mes  Se estn formando todos los rganos internos.  Se forman los dientes debajo de las encas.  Empiezan a Norfolk Southern y los msculos. La columna vertebral tiene movimiento de flexin.  La piel es transparente.  Empiezan a formarse las uas de las manos y Kellogg.  Los brazos y las piernas siguen Leonardo, y se Buyer, retail las manos y los pies.  El feto mide aproximadamente 3pulgadas (7,6cm) de largo. Cuarto mes  La placenta est totalmente formada.  Se han formado los rganos sexuales externos, el cuello, las [place], las cejas, los prpados y las uas Stanley.  El feto puede or, tragar y Unisys Corporation brazos y las piernas.  Los riones LandAmerica Financial a Warden/ranger.  La piel est recubierta por una sustancia sebcea blanca (unto sebceo) y un vello muy fino (lanugo). Quinto mes  El feto se mueve ms y es posible sentirlo por primera vez (da pataditas).  Empieza a dormir y Clinical cytogeneticist, y tal vez comience a chuparse el dedo.  Crecen las uas en las puntas de los dedos.  Funciona el rgano del sistema digestivo que produce bilis (vescula biliar) y [place] a Publishing copy los nutrientes.  Si el beb es nia, tiene vulos en los ovarios. Si el beb es varn, los testculos empiezan a Environmental education officer. Sexto mes  Se han formado los pulmones, pero el feto an no Electrical engineer.  Los ojos se abren. El cerebro sigue desarrollndose.  El beb tiene Franklin Resources dedos de las manos y  los pies. El cabello del beb se vuelve ms abundante.  A fines del segundo trimestre, el feto mide aproximadamente 9pulgadas (22,9cm) de largo. Sptimo mes  El feto patea y se estira.  Los ojos se han desarrollado lo suficiente como para percibir los cambios de [place].  Las manos pueden hacer movimientos de prensin.  El feto responde a los ruidos. Octavo mes  The Mutual of Omaha rganos, as como los sistemas y aparatos del organismo, estn totalmente  desarrollados y en funcionamiento.  Los Affiliated Computer Services se solidifican, y se Therapist, sports botones gustativos. Es posible que el feto tenga hipo.  Determinadas regiones del cerebro an se estn desarrollando. El crneo sigue siendo blando. Noveno mes  El feto aumenta aproximadamente libra (230g) cada semana.  Los pulmones estn totalmente desarrollados.  Se desarrollan los hbitos de sueo.  Generalmente, el feto se acomoda con la cabeza hacia abajo (presentacin ceflica de vrtice) en el tero para prepararse para el parto. En cambio, si los glteos se acomodan en esta posicin, el beb est de nalgas.  El feto pesa entre 6 y 9libras (2,72 y 46,08kg) y Hessie Knows 36 y 20pulgadas [phone] a 50,8cm) de Production designer, theatre/television/film. QU PUEDO HACER PARA QUE EL EMBARAZO SEA SANO Y PARA AYUDAR AL BEB A DESARROLLARSE? Comida y bebida  Consuma una dieta saludable. ? Hable con el mdico para asegurarse de que est recibiendo los nutrientes que usted y el beb necesitan. ? Visite www.BuildDNA.es para obtener ms informacin sobre cmo crear una dieta saludable.  El Sport and exercise psychologist cul es la cantidad saludable de peso a aumentar durante el Enterprise, por lo general, Lyndal Pulley 25 y 35libras (66 y 16kg). Puede ser necesario que: ? Aumente ms si tena bajo peso antes de quedar embarazada o si est embarazada de ms de un beb. ? Aumente menos si tena sobrepeso u obesidad cuando qued [place]. Medicamentos y [place] vitaminas prenatales como se lo haya indicado el mdico, entre ellas, cido flico, hierro, calcio y vitaminaD, que son importantes para el desarrollo saludable.  Tome los medicamentos solamente como se lo haya indicado el mdico. Lea las etiquetas y consulte al farmacutico o al mdico si puede tomar medicamentos de [place], suplementos y medicamentos recetados durante el Media planner. Actividades  Haga actividad fsica como se lo haya aconsejado el mdico. Pdale al mdico que  le recomiende actividades que sean seguras para usted, como caminar o Designer, television/film set.  No participe en deportes extremos ni extenuantes. Estilo de vida  No beba alcohol.  No consuma ningn producto que contenga tabaco, lo que incluye cigarrillos, tabaco de Higher education careers adviser o Psychologist, sport and exercise. Si necesita ayuda para dejar de fumar, consulte al mdico.  No consuma drogas. Seguridad  No se exponga al mercurio, al plomo ni a otros metales pesados. Pregntele al mdico acerca de las fuentes comunes de estos metales pesados.  Evite la infeccin por listeria durante el embarazo. Tome las siguientes precauciones: ? No coma quesos blandos ni fiambres. ? No coma perros calientes, salvo que hayan sido calentados al punto de emitir vapor, por ejemplo, en el microondas. ? No tome leche no pasteurizada.  Evite la infeccin por toxoplasmosis durante el embarazo. Tome las siguientes precauciones: ? No cambie la arena sanitaria del gato, si tiene [place]. Pdale a otra persona que lo haga por usted. ? Use guantes de jardinera mientras trabaja en el jardn. Instrucciones generales  Concurra a todas las visitas de control como se lo haya indicado el mdico. Esto es importante. [place]  de cuidado prenatal y las pruebas de Programme researcher, broadcasting/film/video.  [place] control. Trabaje en estrecha colaboracin con el mdico para Automatic Data bajo control, por ejemplo, la diabetes. CMO S SI EL BEB SE EST DESARROLLANDO BIEN? En cada visita de cuidado prenatal, el mdico har varios estudios diferentes para controlar su estado de salud y Field seismologist un seguimiento del desarrollo del beb. Estos incluyen los siguientes:  Altura uterina. ? El mdico le medir el vientre en crecimiento desde la parte superior a la inferior con una cinta [place]. ? Adems, le palpar el vientre para determinar la posicin del beb.  Latido cardaco. ? Earl Lagos realizada en el primer  trimestre puede confirmar el embarazo y mostrar un latido cardaco, dependiendo del tiempo de Armed forces logistics/support/administrative officer. ? El mdico controlar la frecuencia cardaca del beb en cada visita de cuidado prenatal. ? A medida que se aproxima la fecha de [place], tal vez se hagan controles habituales de la frecuencia cardaca para garantizar que no haya sufrimiento fetal.  Ecografa del segundo trimestre. ? Esta ecografa controla el desarrollo del beb y tambin indica su sexo. QU DEBO HACER SI TENGO ALGUNA INQUIETUD RESPECTO DEL DESARROLLO DEL BEB? Hable siempre con el mdico si tiene alguna inquietud. Esta informacin no tiene Marine scientist el consejo del mdico. Asegrese de hacerle al mdico cualquier pregunta que tenga. Document Released: 06/08/2007 Document Revised: 04/13/2015 Document Reviewed: 05/29/2013 Elsevier Interactive Patient Education  2018 [employer].

## 2016-08-16 NOTE — Progress Notes (Signed)
YEAST INFECTION

## 2016-08-17 LAB — OBSTETRIC PANEL, INCLUDING HIV
ANTIBODY SCREEN: NEGATIVE
BASOS ABS: 0 10*3/uL (ref 0.0–0.2)
Basos: 0 %
EOS (ABSOLUTE): 0.1 10*3/uL (ref 0.0–0.4)
Eos: 1 %
HEMOGLOBIN: 12.8 g/dL (ref 11.1–15.9)
HEP B S AG: NEGATIVE
HIV SCREEN 4TH GENERATION: NONREACTIVE
Hematocrit: 38.8 % (ref 34.0–46.6)
Immature Grans (Abs): 0.1 10*3/uL (ref 0.0–0.1)
Immature Granulocytes: 1 %
LYMPHS ABS: 1.6 10*3/uL (ref 0.7–3.1)
Lymphs: 18 %
MCH: 29.8 pg (ref 26.6–33.0)
MCHC: 33 g/dL (ref 31.5–35.7)
MCV: 90 fL (ref 79–97)
Monocytes Absolute: 0.4 10*3/uL (ref 0.1–0.9)
Monocytes: 4 %
NEUTROS ABS: 7 10*3/uL (ref 1.4–7.0)
Neutrophils: 76 %
PLATELETS: 192 10*3/uL (ref 150–379)
RBC: 4.29 x10E6/uL (ref 3.77–5.28)
RDW: 14.6 % (ref 12.3–15.4)
RH TYPE: POSITIVE
RPR: NONREACTIVE
Rubella Antibodies, IGG: 9.46 index (ref >0.99)
WBC: 9.2 10*3/uL (ref 3.4–10.8)

## 2016-08-17 LAB — HEMOGLOBINOPATHY EVALUATION
FERRITIN: 56 ng/mL (ref 15–150)
HGB A2 QUANT: 2.8 % (ref 1.8–3.2)
HGB C: 0 %
HGB VARIANT: 0 %
Hgb A: 97.2 % (ref 96.4–98.8)
Hgb F Quant: 0 % (ref 0.0–2.0)
Hgb S: 0 %
Hgb Solubility: NEGATIVE

## 2016-08-17 LAB — GC/CHLAMYDIA PROBE AMP (~~LOC~~) NOT AT ARMC
Chlamydia: NEGATIVE
Neisseria Gonorrhea: NEGATIVE

## 2016-08-19 LAB — CULTURE, OB URINE

## 2016-08-19 LAB — URINE CULTURE, OB REFLEX

## 2016-08-26 ENCOUNTER — Ambulatory Visit: Payer: Self-pay | Admitting: Family Medicine

## 2016-09-13 ENCOUNTER — Ambulatory Visit (INDEPENDENT_AMBULATORY_CARE_PROVIDER_SITE_OTHER): Payer: Self-pay | Admitting: Family Medicine

## 2016-09-13 VITALS — BP 111/67 | HR 64 | Wt 137.0 lb

## 2016-09-13 DIAGNOSIS — D352 Benign neoplasm of pituitary gland: Secondary | ICD-10-CM

## 2016-09-13 DIAGNOSIS — O0991 Supervision of high risk pregnancy, unspecified, first trimester: Secondary | ICD-10-CM

## 2016-09-13 NOTE — Progress Notes (Signed)
   PRENATAL VISIT NOTE  Subjective:  Amber Harper is a 31 y.o. G3P2002 at [redacted]w[redacted]d being seen today for ongoing prenatal care.  She is currently monitored for the following issues for this high-risk pregnancy and has Constipation; Tongue mass; Prolactinoma ([place]); Lipoma of abdominal wall; Supervision of high risk pregnancy in first trimester; and Supervision of high risk pregnancy, antepartum, first trimester on her problem list.  Patient reports no complaints, but has a few questions like safe position to sleep and sexual intercourse during the pregnancy. Contractions: Not present. Vag. Bleeding: None.  Movement: Absent. Denies leaking of fluid.   The following portions of the patient's history were reviewed and updated as appropriate: allergies, current medications, past family history, past medical history, past social history, past surgical history and problem list. Problem list updated.  Objective:   Vitals:   09/13/16 0845  BP: 111/67  Pulse: 64  Weight: 137 lb (62.1 kg)    Fetal Status: Fetal Heart Rate (bpm): 144       General:  Alert, oriented and cooperative. Patient is in no acute distress.  Skin: Skin is warm and dry. No rash noted.   Cardiovascular: Normal heart rate noted  Respiratory: Normal respiratory effort, no problems with respiration noted  Abdomen: Soft, gravid, appropriate for gestational age.  Pain/Pressure: Absent     Pelvic: Cervical exam deferred        Extremities: Normal range of motion.  Edema: None  Mental Status:  Normal mood and affect. Normal behavior. Normal judgment and thought content.   Assessment and Plan:  Pregnancy: G3P2002 at [redacted]w[redacted]d  1. Supervision of high risk pregnancy, antepartum, first trimester - Routine PNC, follow up in 4 weeks - Has appt on 9/13 for first trimester screen/NT  2. Prolactinoma ([place]) - Followed by endocrinologist at [facility]. [facility] Previously on bromocriptine, which was stopped  at beginning of pregnancy and endocrine will is monitoring clinically. Has follow up next month.  Please refer to After Visit Summary for other counseling recommendations.  Return in about 4 weeks (around 10/11/2016).  Almyra Free P. Degele, MD OB Fellow  Future Appointments Date Time Provider [place]  09/15/2016 8:00 AM WH-MFC [place] 3 WH-MFCUS MFC-US  09/15/2016 9:00 AM WH-MFC LAB [place] MFC-US

## 2016-09-13 NOTE — Patient Instructions (Signed)
Segundo trimestre de Media planner (Second Trimester of Pregnancy) El segundo trimestre va desde la semana13 hasta la 64, desde el cuarto hasta el sexto mes, y suele ser el momento en el que mejor se siente. En general, las nuseas matutinas han disminuido o han desaparecido completamente. Tendr ms energa y podr aumentarle el apetito. El beb por nacer (feto) se desarrolla rpidamente. Hacia el final del sexto mes, el beb mide aproximadamente 9 pulgadas (23 cm) y pesa alrededor de 1 libras (700 g). Es probable que sienta al beb moverse (dar pataditas) entre las 18 y 77 semanas del Media planner. CUIDADOS EN EL HOGAR  No fume, no consuma hierbas ni beba alcohol. No tome frmacos que el mdico no haya autorizado.  No consuma ningn producto que contenga tabaco, lo que incluye cigarrillos, tabaco de Higher education careers adviser o Psychologist, sport and exercise. Si necesita ayuda para dejar de fumar, consulte al Mead[employer]vaco. Puede recibir asesoramiento u otro tipo de apoyo para dejar de fumar.  Tome los medicamentos solamente como se lo haya indicado el mdico. Algunos medicamentos son seguros para tomar durante el Media planner y otros no lo son.  Haga ejercicios solamente como se lo haya indicado el mdico. Interrumpa la actividad fsica si comienza a tener calambres.  Ingiera alimentos saludables de [place] regular.  Use un sostn que le brinde buen soporte si sus mamas estn sensibles.  No se d baos de inmersin en agua caliente, baos turcos ni saunas.  Colquese el cinturn de seguridad cuando conduzca.  No coma carne cruda ni queso sin cocinar; evite el contacto con las bandejas sanitarias de los gatos y la tierra que estos animales usan.  Seama.  Tome entre 1500 y 2082m de calcio diariamente comenzando en la [id number] embarazo hMartinsdale  Pruebe tomar un medicamento que la ayude a defecar (un laxante suave) si el mdico lo autoriza. Consuma ms fibra, que se encuentra en las frutas y  verduras frescas y los cereales integrales. Beba suficiente lquido para mantener el pis (orina) claro o de color amarillo plido.  Dese baos de asiento con agua tibia para aBest boyo las molestias causadas por las hemorroides. Use una crema para las hemorroides si el mdico la autoriza.  Si se le hinchan las venas (venas varicosas), use medias de descanso. Levante (eleve) los pies durante 168mutos, 3 o 4veces por daTraining and development officerLimite el consumo de sal en su dieta.  No levante objetos pesados, use zapatos de tacones bajos y sintese derecha.  Descanse con las piernas elevadas si tiene calambres o dolor de cintura.  Visite a su dentista si no lo ha heQuarry managerUse un cepillo de cerdas suaves para cepillarse los dientes. Psese el hilo dental con suavidad.  Puede seguir maAmerican Electric Powera menos que el mdico le indique lo contrario.  Concurra a los controles mdicos.  SOLICITE AYUDA SI:  Siente mareos.  Sufre calambres o presin leves en la parte baja del vientre (abdomen).  Sufre un dolor persistente en el abdomen.  Tiene maHigher education careers advisernuseas), vmitos, o tiene deposiciones acuosas (diarrea).  Advierte un olor ftido que proviene de la vagina.  Siente dolor al orContinental Airlines SOLICITE AYUDA DE INMEDIATO SI:  Tiene fiebre.  Tiene una prdida de lquido por la vagina.  Tiene sangrado o pequeas prdidas vaginales.  Siente dolor intenso o clicos en el abdomen.  Sube o baja de peso rpidamente.  Tiene dificultades para recuperar el aliento y siente dolor en el pecho.  Sbitamente se  le hinchan mucho el rostro, las manos, los tobillos, los pies o las piernas.  No ha sentido los movimientos del beb durante una hora.  Siente un dolor de cabeza intenso que no se alivia con medicamentos.  Su visin se modifica.  Esta informacin no tiene como fin reemplazar el consejo del mdico. Asegrese de hacerle al mdico cualquier pregunta que  tenga. Document Released: 08/22/2012 Document Revised: 01/10/2014 Document Reviewed: 02/21/2012 Elsevier Interactive Patient Education  2017 Elsevier Inc.  

## 2016-09-15 ENCOUNTER — Ambulatory Visit (HOSPITAL_COMMUNITY)
Admission: RE | Admit: 2016-09-15 | Discharge: 2016-09-15 | Disposition: A | Discharge status: Home / Self Care | Payer: Self-pay | Source: Ambulatory Visit | Attending: Student | Admitting: Student

## 2016-09-15 ENCOUNTER — Encounter (HOSPITAL_COMMUNITY): Payer: Self-pay

## 2016-09-15 ENCOUNTER — Other Ambulatory Visit: Payer: Self-pay | Admitting: Student

## 2016-09-15 DIAGNOSIS — D1779 Benign lipomatous neoplasm of other sites: Secondary | ICD-10-CM | POA: Insufficient documentation

## 2016-09-15 DIAGNOSIS — D171 Benign lipomatous neoplasm of skin and subcutaneous tissue of trunk: Secondary | ICD-10-CM

## 2016-09-15 DIAGNOSIS — O34219 Maternal care for unspecified type scar from previous cesarean delivery: Secondary | ICD-10-CM | POA: Insufficient documentation

## 2016-09-15 DIAGNOSIS — O0991 Supervision of high risk pregnancy, unspecified, first trimester: Secondary | ICD-10-CM | POA: Insufficient documentation

## 2016-09-15 DIAGNOSIS — D352 Benign neoplasm of pituitary gland: Secondary | ICD-10-CM

## 2016-09-15 DIAGNOSIS — Z3A13 13 weeks gestation of pregnancy: Secondary | ICD-10-CM | POA: Insufficient documentation

## 2016-09-15 DIAGNOSIS — O99211 Obesity complicating pregnancy, first trimester: Secondary | ICD-10-CM | POA: Insufficient documentation

## 2016-09-15 DIAGNOSIS — O2691 Pregnancy related conditions, unspecified, first trimester: Secondary | ICD-10-CM | POA: Insufficient documentation

## 2016-09-15 DIAGNOSIS — Z3682 Encounter for antenatal screening for nuchal translucency: Secondary | ICD-10-CM | POA: Insufficient documentation

## 2016-09-30 ENCOUNTER — Encounter (HOSPITAL_COMMUNITY): Payer: Self-pay | Admitting: *Deleted

## 2016-09-30 ENCOUNTER — Inpatient Hospital Stay (HOSPITAL_COMMUNITY)
Admission: AD | Admit: 2016-09-30 | Discharge: 2016-09-30 | Disposition: A | Discharge status: Home / Self Care | Payer: Self-pay | Source: Ambulatory Visit | Attending: Obstetrics & Gynecology | Admitting: Obstetrics & Gynecology

## 2016-09-30 ENCOUNTER — Other Ambulatory Visit: Payer: Self-pay

## 2016-09-30 DIAGNOSIS — N949 Unspecified condition associated with female genital organs and menstrual cycle: Secondary | ICD-10-CM

## 2016-09-30 DIAGNOSIS — Z3A15 15 weeks gestation of pregnancy: Secondary | ICD-10-CM | POA: Insufficient documentation

## 2016-09-30 DIAGNOSIS — R102 Pelvic and perineal pain: Secondary | ICD-10-CM | POA: Insufficient documentation

## 2016-09-30 DIAGNOSIS — O26892 Other specified pregnancy related conditions, second trimester: Secondary | ICD-10-CM | POA: Insufficient documentation

## 2016-09-30 LAB — URINALYSIS, MICROSCOPIC (REFLEX): WBC UA: NONE SEEN WBC/hpf (ref 0–5)

## 2016-09-30 LAB — GC/CHLAMYDIA PROBE AMP (~~LOC~~) NOT AT ARMC
CHLAMYDIA, DNA PROBE: NEGATIVE
NEISSERIA GONORRHEA: NEGATIVE

## 2016-09-30 LAB — WET PREP, GENITAL
CLUE CELLS WET PREP: NONE SEEN
SPERM: NONE SEEN
TRICH WET PREP: NONE SEEN
WBC, Wet Prep HPF POC: NONE SEEN
Yeast Wet Prep HPF POC: NONE SEEN

## 2016-09-30 LAB — URINALYSIS, ROUTINE W REFLEX MICROSCOPIC
BILIRUBIN URINE: NEGATIVE
GLUCOSE, UA: NEGATIVE mg/dL
KETONES UR: NEGATIVE mg/dL
Leukocytes, UA: NEGATIVE
NITRITE: NEGATIVE
PH: 6.5 (ref 5.0–8.0)
PROTEIN: NEGATIVE mg/dL
Specific Gravity, Urine: <1.005 — ABNORMAL LOW (ref 1.005–1.030)

## 2016-09-30 NOTE — MAU Note (Signed)
PT SAYS LEFT SIDED PAIN INTO HER BACK - STARTED AT 10PM.    NO MED FOR PAIN.   [place] - WITH CLINIC.     LAST SEX -   Tuesday

## 2016-09-30 NOTE — Discharge Instructions (Signed)
Dolor del ligamento redondo °(Round Ligament Pain) °El ligamento redondo es un cordón de músculo y tejido que sirve de sostén para el útero. Puede volverse una fuente de dolor durante el embarazo si se distiende o se torsiona a medida que el bebé crece. Generalmente, el dolor empieza en el segundo trimestre de embarazo, y puede aparecer y desaparecer hasta el momento del parto. No se trata de un problema grave y no es perjudicial para el bebé. °El dolor del ligamento redondo suele ser agudo y punzante, y durar poco tiempo, pero también puede ser sordo, persistente y continuo. Se lo percibe en la región inferior del abdomen o en la ingle. A menudo comienza en la zona más profunda de la ingle y se extiende hacia región externa de la cadera. El dolor puede aparecer en los siguientes casos: °· Al cambiar repentinamente de posición. °· Al darse vuelta en la cama. °· Al toser o estornudar. °· Al realizar actividad física. °INSTRUCCIONES PARA EL CUIDADO EN EL HOGAR °Controle su afección para ver si hay cambios. Siga estos pasos para aliviar el dolor: °· Cuando el dolor comience, relájese. Luego intente lo siguiente: °? Sentarse. °? Flexionar las rodillas hacia el abdomen. °? Acostarse de costado con una almohada debajo del abdomen y otra entre las piernas. °? Sentarse en una bañera con agua tibia durante 15 a 20 minutos o hasta que el dolor desaparezca. °· Tome los medicamentos de venta libre y los recetados solamente como se lo haya indicado el médico. °· Haga movimientos lentos al sentarse y pararse. °· No haga caminatas largas si le generan dolor. °· Suspenda o reduzca las actividades físicas si le generan dolor. °SOLICITE ATENCIÓN MÉDICA SI: °· El dolor no desaparece con el tratamiento. °· Tiene un dolor en la espalda que no tenía antes. °· El medicamento no resulta eficaz. °SOLICITE ATENCIÓN MÉDICA DE INMEDIATO SI: °· Tiene escalofríos o fiebre. °· Tiene contracciones uterinas. °· Presenta hemorragia  vaginal. °· Siente náuseas o vómitos. °· Tiene diarrea. °· Siente dolor al orinar. °Esta información no tiene como fin reemplazar el consejo del médico. Asegúrese de hacerle al médico cualquier pregunta que tenga. °Document Released: 12/03/2007 Document Revised: 03/14/2011 Document Reviewed: 02/26/2014 °Elsevier Interactive Patient Education © 2018 Elsevier Inc. ° °

## 2016-09-30 NOTE — MAU Provider Note (Signed)
Patient Amber Harper is a 31 y.o. G3P2002 At [redacted]w[redacted]d here with complaints of abdominal pain that started at 10 pm. She denies bleeding, abnormal vagina discharge, dysuria or other ob-gyn complaints.  Her pregnancy is signifcant for prolactinoma being monitored at [facility]  History     CSN: [id number]  Arrival date and time: 09/30/16 0109   None     Chief Complaint  Patient presents with  . Abdominal Pain   HPI  OB History    Gravida Para Term Preterm AB Living   3 2 2     2    SAB TAB Ectopic Multiple Live Births           2      Past Medical History:  Diagnosis Date  . Arthritis   . EXHBZJIR(678.9)     Past Surgical History:  Procedure Laterality Date  . CESAREAN SECTION    . MASS EXCISION Left 09/14/2012   Procedure: EXCISION MASS;  Surgeon: Ruby Cola, MD;  Location: [facility] OR;  Service: ENT;  Laterality: Left;  removal of tongue mass    Family History  Problem Relation Age of Onset  . Diabetes Mother   . Diabetes Maternal Grandmother   . Diabetes Paternal Grandmother     Social History  Substance Use Topics  . Smoking status: Never Smoker  . Smokeless tobacco: Never Used  . Alcohol use No    Allergies: No Known Allergies  Prescriptions Prior to Admission  Medication Sig Dispense Refill Last Dose  . Prenatal Multivit-Min-Fe-FA (PRENATAL VITAMINS) 0.8 MG tablet Take 1 tablet by mouth daily. 30 tablet 12 09/29/2016 at Unknown time  . Prenatal Vit-Fe Fumarate-FA (PRENATAL MULTIVITAMIN) TABS tablet Take 1 tablet by mouth daily at 12 noon.   Not Taking  . terconazole (TERAZOL 7) 0.4 % vaginal cream Place 1 applicator vaginally at bedtime. (Patient not taking: Reported on 09/15/2016) 45 g 0 Not Taking    Review of Systems  Constitutional: Negative.   HENT: Negative.   Respiratory: Negative.   Cardiovascular: Negative.   Gastrointestinal: Positive for abdominal pain.  Genitourinary: Negative.   Neurological: Negative.    Physical Exam   Blood  pressure (!) 97/54, pulse 79, temperature 98.2 F (36.8 C), temperature source Oral, resp. rate 20, height 4\' 7"  (1.397 m), weight 135 lb (61.2 kg), last menstrual period 06/15/2016.  Physical Exam  Constitutional: She is oriented to person, place, and time. She appears well-developed.  HENT:  Head: Normocephalic.  Neck: Normal range of motion.  Respiratory: Effort normal.  GI: Soft.  Musculoskeletal: Normal range of motion.  Neurological: She is alert and oriented to person, place, and time.  Skin: Skin is warm and dry.  Psychiatric: She has a normal mood and affect.    MAU Course  Procedures  MDM FHR is 147 Wet prep: negative gc ct: pending UA; no signs of UTI  Assessment and Plan   1. Round ligament pain    2. Patient stable for discharge with recommendation to purchase pregnancy support belt and follow instructions for round ligament pain.  3. Return precautions given, all questions answered.   Mervyn Skeeters Eden Toohey CNM 09/30/2016, 2:44 AM

## 2016-10-10 ENCOUNTER — Ambulatory Visit (INDEPENDENT_AMBULATORY_CARE_PROVIDER_SITE_OTHER): Payer: Self-pay | Admitting: Obstetrics & Gynecology

## 2016-10-10 VITALS — BP 103/59 | HR 65 | Wt 134.1 lb

## 2016-10-10 DIAGNOSIS — O0992 Supervision of high risk pregnancy, unspecified, second trimester: Secondary | ICD-10-CM

## 2016-10-10 DIAGNOSIS — Z23 Encounter for immunization: Secondary | ICD-10-CM

## 2016-10-10 DIAGNOSIS — Z113 Encounter for screening for infections with a predominantly sexual mode of transmission: Secondary | ICD-10-CM

## 2016-10-10 DIAGNOSIS — O26892 Other specified pregnancy related conditions, second trimester: Secondary | ICD-10-CM

## 2016-10-10 DIAGNOSIS — O0990 Supervision of high risk pregnancy, unspecified, unspecified trimester: Secondary | ICD-10-CM

## 2016-10-10 DIAGNOSIS — N898 Other specified noninflammatory disorders of vagina: Secondary | ICD-10-CM

## 2016-10-10 DIAGNOSIS — O0991 Supervision of high risk pregnancy, unspecified, first trimester: Secondary | ICD-10-CM

## 2016-10-10 DIAGNOSIS — D352 Benign neoplasm of pituitary gland: Secondary | ICD-10-CM

## 2016-10-10 MED ORDER — TERCONAZOLE 0.4 % VA CREA: 1.0000 | TOPICAL_CREAM | Freq: Every day | VAGINAL | 0 refills | Status: DC

## 2016-10-10 MED FILL — TERCONAZOLE 0.4% VAG CREAM: 0.4 | 7 days supply | Qty: 45 | Fill #0

## 2016-10-10 NOTE — Progress Notes (Signed)
Spanish Interpreter Geophysicist/field seismologist Scheduled Anatomy US at [facility] due to pt self pay     PRENATAL VISIT NOTE  Subjective:  Amber Harper is a 31 y.o. G3P2002 at [redacted]w[redacted]d being seen today for ongoing prenatal care.  She is currently monitored for the following issues for this high-risk pregnancy and has Constipation; Tongue mass; Prolactinoma ([place]); Lipoma of abdominal wall; and Supervision of high risk pregnancy, antepartum, first trimester on her problem list.  Patient reports no complaints.  Contractions: Not present. Vag. Bleeding: None.  Movement: Absent. Denies leaking of fluid.   The following portions of the patient's history were reviewed and updated as appropriate: allergies, current medications, past family history, past medical history, past social history, past surgical history and problem list. Problem list updated.  Objective:   Vitals:   10/10/16 0859  BP: (!) 103/59  Pulse: 65  Weight: 134 lb 1.6 oz (60.8 kg)    Fetal Status: Fetal Heart Rate (bpm): 150   Movement: Absent     General:  Alert, oriented and cooperative. Patient is in no acute distress.  Skin: Skin is warm and dry. No rash noted.   Cardiovascular: Normal heart rate noted  Respiratory: Normal respiratory effort, no problems with respiration noted  Abdomen: Soft, gravid, appropriate for gestational age.  Pain/Pressure: Absent     Pelvic: Cervical exam deferred        Extremities: Normal range of motion.  Edema: None  Mental Status:  Normal mood and affect. Normal behavior. Normal judgment and thought content.   Assessment and Plan:  Pregnancy: G3P2002 at [redacted]w[redacted]d  1. Supervision of high risk pregnancy, antepartum - Flu Vaccine QUAD 36+ mos IM (Fluarix, Quad PF) - AFP, Serum, Open Spina Bifida  2. Vaginal discharge during pregnancy in second trimester Appears to be yeast--white curd like Treat empirically for yeast.  Will also check for BV (Terazol) - Cervicovaginal ancillary only  3.  Prolactinoma ([place]) - Prolactin - Has appointment with Endo - No visual field defect. - Has occasional headaches; none today.  Preterm labor symptoms and general obstetric precautions including but not limited to vaginal bleeding, contractions, leaking of fluid and fetal movement were reviewed in detail with the patient. Please refer to After Visit Summary for other counseling recommendations.   4 weeks RTC  Silas [place], MD

## 2016-10-10 NOTE — Progress Notes (Signed)
[facility] [place] on 10/24/16 10:30

## 2016-10-11 LAB — CERVICOVAGINAL ANCILLARY ONLY
Bacterial vaginitis: NEGATIVE
Candida vaginitis: POSITIVE — AB
TRICH (WINDOWPATH): NEGATIVE

## 2016-10-12 LAB — AFP, SERUM, OPEN SPINA BIFIDA
AFP MoM: 0.88
AFP Value: 34.3 ng/mL
GEST. AGE ON COLLECTION DATE: 16.7 wk
MATERNAL AGE AT EDD: 32.7 a
OSBR Risk 1 IN: 10000
TEST RESULTS AFP: NEGATIVE
Weight: 134 [lb_av]

## 2016-10-12 LAB — PROLACTIN: PROLACTIN: 153.3 ng/mL — AB (ref 4.8–23.3)

## 2016-11-07 ENCOUNTER — Ambulatory Visit (INDEPENDENT_AMBULATORY_CARE_PROVIDER_SITE_OTHER): Payer: Self-pay | Admitting: Obstetrics & Gynecology

## 2016-11-07 VITALS — BP 98/58 | HR 71 | Wt 135.8 lb

## 2016-11-07 DIAGNOSIS — D352 Benign neoplasm of pituitary gland: Secondary | ICD-10-CM

## 2016-11-07 DIAGNOSIS — R220 Localized swelling, mass and lump, head: Secondary | ICD-10-CM

## 2016-11-07 DIAGNOSIS — K148 Other diseases of tongue: Secondary | ICD-10-CM

## 2016-11-07 DIAGNOSIS — D171 Benign lipomatous neoplasm of skin and subcutaneous tissue of trunk: Secondary | ICD-10-CM

## 2016-11-07 DIAGNOSIS — O0991 Supervision of high risk pregnancy, unspecified, first trimester: Secondary | ICD-10-CM

## 2016-11-07 NOTE — Progress Notes (Signed)
C/o pain above navel sometimes, and feels a " ball"sometimes,not today.

## 2016-11-07 NOTE — Patient Instructions (Signed)
Segundo trimestre de Media planner (Second Trimester of Pregnancy) El segundo trimestre va desde la semana13 hasta la 64, desde el cuarto hasta el sexto mes, y suele ser el momento en el que mejor se siente. En general, las nuseas matutinas han disminuido o han desaparecido completamente. Tendr ms energa y podr aumentarle el apetito. El beb por nacer (feto) se desarrolla rpidamente. Hacia el final del sexto mes, el beb mide aproximadamente 9 pulgadas (23 cm) y pesa alrededor de 1 libras (700 g). Es probable que sienta al beb moverse (dar pataditas) entre las 18 y 77 semanas del Media planner. CUIDADOS EN EL HOGAR  No fume, no consuma hierbas ni beba alcohol. No tome frmacos que el mdico no haya autorizado.  No consuma ningn producto que contenga tabaco, lo que incluye cigarrillos, tabaco de Higher education careers adviser o Psychologist, sport and exercise. Si necesita ayuda para dejar de fumar, consulte al Mead[employer]vaco. Puede recibir asesoramiento u otro tipo de apoyo para dejar de fumar.  Tome los medicamentos solamente como se lo haya indicado el mdico. Algunos medicamentos son seguros para tomar durante el Media planner y otros no lo son.  Haga ejercicios solamente como se lo haya indicado el mdico. Interrumpa la actividad fsica si comienza a tener calambres.  Ingiera alimentos saludables de [place] regular.  Use un sostn que le brinde buen soporte si sus mamas estn sensibles.  No se d baos de inmersin en agua caliente, baos turcos ni saunas.  Colquese el cinturn de seguridad cuando conduzca.  No coma carne cruda ni queso sin cocinar; evite el contacto con las bandejas sanitarias de los gatos y la tierra que estos animales usan.  Seama.  Tome entre 1500 y 2082m de calcio diariamente comenzando en la [id number] embarazo hMartinsdale  Pruebe tomar un medicamento que la ayude a defecar (un laxante suave) si el mdico lo autoriza. Consuma ms fibra, que se encuentra en las frutas y  verduras frescas y los cereales integrales. Beba suficiente lquido para mantener el pis (orina) claro o de color amarillo plido.  Dese baos de asiento con agua tibia para aBest boyo las molestias causadas por las hemorroides. Use una crema para las hemorroides si el mdico la autoriza.  Si se le hinchan las venas (venas varicosas), use medias de descanso. Levante (eleve) los pies durante 168mutos, 3 o 4veces por daTraining and development officerLimite el consumo de sal en su dieta.  No levante objetos pesados, use zapatos de tacones bajos y sintese derecha.  Descanse con las piernas elevadas si tiene calambres o dolor de cintura.  Visite a su dentista si no lo ha heQuarry managerUse un cepillo de cerdas suaves para cepillarse los dientes. Psese el hilo dental con suavidad.  Puede seguir maAmerican Electric Powera menos que el mdico le indique lo contrario.  Concurra a los controles mdicos.  SOLICITE AYUDA SI:  Siente mareos.  Sufre calambres o presin leves en la parte baja del vientre (abdomen).  Sufre un dolor persistente en el abdomen.  Tiene maHigher education careers advisernuseas), vmitos, o tiene deposiciones acuosas (diarrea).  Advierte un olor ftido que proviene de la vagina.  Siente dolor al orContinental Airlines SOLICITE AYUDA DE INMEDIATO SI:  Tiene fiebre.  Tiene una prdida de lquido por la vagina.  Tiene sangrado o pequeas prdidas vaginales.  Siente dolor intenso o clicos en el abdomen.  Sube o baja de peso rpidamente.  Tiene dificultades para recuperar el aliento y siente dolor en el pecho.  Sbitamente se  le hinchan mucho el rostro, las manos, los tobillos, los pies o las piernas.  No ha sentido los movimientos del beb durante una hora.  Siente un dolor de cabeza intenso que no se alivia con medicamentos.  Su visin se modifica.  Esta informacin no tiene como fin reemplazar el consejo del mdico. Asegrese de hacerle al mdico cualquier pregunta que  tenga. Document Released: 08/22/2012 Document Revised: 01/10/2014 Document Reviewed: 02/21/2012 Elsevier Interactive Patient Education  2017 Elsevier Inc.  

## 2016-11-07 NOTE — Progress Notes (Signed)
   PRENATAL VISIT NOTE  Subjective:  Amber Harper is a 31 y.o. G3P2002 at [redacted]w[redacted]d being seen today for ongoing prenatal care.  She is currently monitored for the following issues for this low-risk pregnancy and has Constipation; Tongue mass; Prolactinoma ([place]); Lipoma of abdominal wall; and Supervision of high risk pregnancy, antepartum, first trimester on their problem list.  Patient reports bulging above umbilicus with strain.  Contractions: Not present. Vag. Bleeding: None.  Movement: Present. Denies leaking of fluid.   The following portions of the patient's history were reviewed and updated as appropriate: allergies, current medications, past family history, past medical history, past social history, past surgical history and problem list. Problem list updated.  Objective:   Vitals:   11/07/16 0919  BP: (!) 98/58  Pulse: 71  Weight: 135 lb 12.8 oz (61.6 kg)    Fetal Status: Fetal Heart Rate (bpm): 150   Movement: Present     General:  Alert, oriented and cooperative. Patient is in no acute distress.  Skin: Skin is warm and dry. No rash noted.   Cardiovascular: Normal heart rate noted  Respiratory: Normal respiratory effort, no problems with respiration noted  Abdomen: Soft, gravid, appropriate for gestational age.  Pain/Pressure: Present     Pelvic: Cervical exam deferred        Extremities: Normal range of motion.  Edema: None  Mental Status:  Normal mood and affect. Normal behavior. Normal judgment and thought content.   Assessment and Plan:  Pregnancy: G3P2002 at [redacted]w[redacted]d  1. Supervision of high risk pregnancy, antepartum, first trimester Possible diastasis recti  2. Lipoma of abdominal wall Normal CT 2016  3. Prolactinoma ([place]) No meds  4. Tongue mass Removed 4 years ago  Preterm labor symptoms and general obstetric precautions including but not limited to vaginal bleeding, contractions, leaking of fluid and fetal movement were reviewed in detail with the  patient. Please refer to After Visit Summary for other counseling recommendations.  Return in about 4 weeks (around 12/05/2016).   Emeterio Reeve, MD

## 2016-11-18 ENCOUNTER — Ambulatory Visit: Payer: Self-pay | Attending: Internal Medicine

## 2016-11-28 ENCOUNTER — Ambulatory Visit: Payer: Self-pay | Admitting: Nurse Practitioner

## 2016-11-28 ENCOUNTER — Encounter: Payer: Self-pay | Admitting: Nurse Practitioner

## 2016-11-29 NOTE — Progress Notes (Signed)
This encounter was created in error - please disregard.

## 2016-12-05 ENCOUNTER — Encounter: Payer: Self-pay | Admitting: Obstetrics and Gynecology

## 2016-12-05 ENCOUNTER — Ambulatory Visit (INDEPENDENT_AMBULATORY_CARE_PROVIDER_SITE_OTHER): Payer: Self-pay | Admitting: Obstetrics and Gynecology

## 2016-12-05 VITALS — BP 100/56 | HR 80 | Wt 139.8 lb

## 2016-12-05 DIAGNOSIS — Z98891 History of uterine scar from previous surgery: Secondary | ICD-10-CM

## 2016-12-05 DIAGNOSIS — O0991 Supervision of high risk pregnancy, unspecified, first trimester: Secondary | ICD-10-CM

## 2016-12-05 DIAGNOSIS — D352 Benign neoplasm of pituitary gland: Secondary | ICD-10-CM

## 2016-12-05 DIAGNOSIS — B379 Candidiasis, unspecified: Secondary | ICD-10-CM

## 2016-12-05 DIAGNOSIS — O34219 Maternal care for unspecified type scar from previous cesarean delivery: Secondary | ICD-10-CM | POA: Insufficient documentation

## 2016-12-05 MED ORDER — CLOTRIMAZOLE 1 % VA CREA: 1.0000 | TOPICAL_CREAM | Freq: Every day | VAGINAL | 2 refills | Status: DC

## 2016-12-05 NOTE — Progress Notes (Signed)
Spanish interpreter "Paco" [phone]

## 2016-12-05 NOTE — Progress Notes (Signed)
   PRENATAL VISIT NOTE  Subjective:  Amber Harper is a 31 y.o. G3P2002 at [redacted]w[redacted]d being seen today for ongoing prenatal care.  She is currently monitored for the following issues for this low-risk pregnancy and has Constipation; Prolactinoma ([place]); Supervision of high risk pregnancy, antepartum, first trimester; and H/O: C-section on their problem list.  Patient reports no complaints. She had some cottage cheese discharge but took terconazole she had at home.   . Vag. Bleeding: None.   .Reports normal fetal movement. Denies leaking of fluid.    The following portions of the patient's history were reviewed and updated as appropriate: allergies, current medications, past family history, past medical history, past social history, past surgical history and problem list. Problem list updated.  Objective:   Vitals:   12/05/16 0929  BP: (!) 100/56  Pulse: 80  Weight: 139 lb 12.8 oz (63.4 kg)    Fetal Status: Fetal Heart Rate (bpm): 151         General:  Alert, oriented and cooperative. Patient is in no acute distress.  Skin: Skin is warm and dry. No rash noted.   Cardiovascular: Normal heart rate noted  Respiratory: Normal respiratory effort, no problems with respiration noted  Abdomen: Soft, gravid, appropriate for gestational age.        Pelvic: Cervical exam deferred        Extremities: Normal range of motion.  Edema: None  Mental Status:  Normal mood and affect. Normal behavior. Normal judgment and thought content.   Assessment and Plan:  Pregnancy: G3P2002 at [redacted]w[redacted]d  1. Supervision of high risk pregnancy, antepartum, first trimester  2. Prolactinoma ([place]) No visual issues No meds  3. H/O: C-section Reviewed risks/benefits of TOLAC versus RCS in detail. Patient counseled regarding potential vaginal delivery, chance of success, future implications, possible uterine rupture and need for urgent/emergent repeat cesarean. Counseled regarding scheduled repeat cesarean including  risks of bleeding, infection, damage to surrounding tissue, abnormal placentation, implications for future pregnancies. All questions answered.  Patient desires TOLAC, consent signed 12/05/2016.  4. Yeast infection Monthly infections Sent clotrimazole to pharmacy so she will have refills prn, to present if any difference in infections or if symptoms not improving   Preterm labor symptoms and general obstetric precautions including but not limited to vaginal bleeding, contractions, leaking of fluid and fetal movement were reviewed in detail with the patient. Please refer to After Visit Summary for other counseling recommendations.  Return in about 3 weeks (around 12/26/2016) for OB visit, 2 hr GTT.   Sloan Leiter, MD

## 2016-12-20 ENCOUNTER — Encounter: Payer: Self-pay | Admitting: *Deleted

## 2016-12-28 ENCOUNTER — Ambulatory Visit (INDEPENDENT_AMBULATORY_CARE_PROVIDER_SITE_OTHER): Payer: Self-pay | Admitting: Obstetrics & Gynecology

## 2016-12-28 ENCOUNTER — Encounter: Payer: Self-pay | Admitting: Obstetrics & Gynecology

## 2016-12-28 VITALS — BP 104/62 | HR 75 | Wt 140.6 lb

## 2016-12-28 DIAGNOSIS — O0993 Supervision of high risk pregnancy, unspecified, third trimester: Secondary | ICD-10-CM

## 2016-12-28 DIAGNOSIS — O34219 Maternal care for unspecified type scar from previous cesarean delivery: Secondary | ICD-10-CM

## 2016-12-28 DIAGNOSIS — D352 Benign neoplasm of pituitary gland: Secondary | ICD-10-CM

## 2016-12-28 DIAGNOSIS — Z23 Encounter for immunization: Secondary | ICD-10-CM

## 2016-12-28 MED ORDER — TETANUS-DIPHTH-ACELL PERTUSSIS 5-2.5-18.5 LF-MCG/0.5 IM SUSP
0.5000 mL | Freq: Once | INTRAMUSCULAR | Status: AC
  Administered 2016-12-28: 0.5 mL via INTRAMUSCULAR

## 2016-12-28 NOTE — Progress Notes (Signed)
   PRENATAL VISIT NOTE  Subjective:  Amber Harper is a 31 y.o. G3P2002 at [redacted]w[redacted]d being seen today for ongoing prenatal care.  Patient is Spanish-speaking only, Spanish interpreter present for this encounter. She is currently monitored for the following issues for this high-risk pregnancy and has Prolactinoma ([place]); Supervision of high risk pregnancy, antepartum, third trimester; and Previous cesarean delivery followed by VBAC on their problem list.  Patient reports no complaints.  Contractions: Not present. Vag. Bleeding: None.  Movement: Present. Denies leaking of fluid.   The following portions of the patient's history were reviewed and updated as appropriate: allergies, current medications, past family history, past medical history, past social history, past surgical history and problem list. Problem list updated.  Objective:   Vitals:   12/28/16 0803  BP: 104/62  Pulse: 75  Weight: 140 lb 9.6 oz (63.8 kg)    Fetal Status: Fetal Heart Rate (bpm): 148 Fundal Height: 29 cm Movement: Present     General:  Alert, oriented and cooperative. Patient is in no acute distress.  Skin: Skin is warm and dry. No rash noted.   Cardiovascular: Normal heart rate noted  Respiratory: Normal respiratory effort, no problems with respiration noted  Abdomen: Soft, gravid, appropriate for gestational age.  Pain/Pressure: Absent     Pelvic: Cervical exam deferred        Extremities: Normal range of motion.  Edema: None  Mental Status:  Normal mood and affect. Normal behavior. Normal judgment and thought content.   Assessment and Plan:  Pregnancy: G3P2002 at [redacted]w[redacted]d  1. Prolactinoma ([place]) Will follow up levels - Prolactin - TSH  2. Previous cesarean delivery, antepartum TOLAC consent signed 12/05/16  3. Supervision of high risk pregnancy in third trimester Third trimester labs, Tdap today. - Glucose Tolerance, 2 Hours w/1 Hour - CBC - RPR - HIV antibody - Tdap (BOOSTRIX) injection 0.5  mL Preterm labor symptoms and general obstetric precautions including but not limited to vaginal bleeding, contractions, leaking of fluid and fetal movement were reviewed in detail with the patient. Please refer to After Visit Summary for other counseling recommendations.  Return in about 2 weeks (around 01/11/2017) for OB Visit ([place]).   Verita Schneiders, MD

## 2016-12-28 NOTE — Patient Instructions (Addendum)
Regrese a la clinica cuando tenga su cita. Si tiene problemas o preguntas, llama a la clinica o vaya a la sala de [place].   Est listo para activar su cuenta de MyChart?  Su cdigo de activacin es: Activation code not generated Current MyChart Status: Active Vaya a [email or web address],  o descargue la aplicacin MyChart. Vaya a la [employer], busque "MyChart", habra la aplicacin, seleccione [employer] y active su cuenta. Necesita ayuda? Llame al [phone]) o enve un correo electrnico a [email or web address]

## 2016-12-29 LAB — HIV ANTIBODY (ROUTINE TESTING W REFLEX): HIV Screen 4th Generation wRfx: NONREACTIVE

## 2016-12-29 LAB — CBC
HEMOGLOBIN: 10.4 g/dL — AB (ref 11.1–15.9)
Hematocrit: 31.6 % — ABNORMAL LOW (ref 34.0–46.6)
MCH: 30.8 pg (ref 26.6–33.0)
MCHC: 32.9 g/dL (ref 31.5–35.7)
MCV: 94 fL (ref 79–97)
PLATELETS: 151 10*3/uL (ref 150–379)
RBC: 3.38 x10E6/uL — AB (ref 3.77–5.28)
RDW: 14.4 % (ref 12.3–15.4)
WBC: 9.1 10*3/uL (ref 3.4–10.8)

## 2016-12-29 LAB — GLUCOSE TOLERANCE, 2 HOURS W/ 1HR
GLUCOSE, 1 HOUR: 170 mg/dL (ref 65–179)
GLUCOSE, FASTING: 94 mg/dL — AB (ref 65–91)
Glucose, 2 hour: 141 mg/dL (ref 65–152)

## 2016-12-29 LAB — TSH: TSH: 1.84 u[IU]/mL (ref 0.450–4.500)

## 2016-12-29 LAB — RPR: RPR: NONREACTIVE

## 2016-12-29 LAB — PROLACTIN: Prolactin: 200.1 ng/mL — ABNORMAL HIGH (ref 4.8–23.3)

## 2017-01-03 NOTE — L&D Delivery Note (Signed)
Patient is 32 y.o. [id number] [redacted]w[redacted]d admitted with SOL. S/p augmentation with Pitocin. AROM at 2038.  Prenatal course also complicated by GDM.  Delivery Note At 5:29 AM a viable female was delivered via Vaginal, Spontaneous (Presentation: LOA).  APGAR: 5, 7; weight pending.   Placenta status: Intact.  Cord: 3V  with the following complications: Marginal insertion.  Cord pH: 7.1  Anesthesia: Epidural  Episiotomy: None Lacerations: None Est. Blood Loss (mL): 200  Mom to postpartum.  Baby to Couplet care / Skin to Skin.  Luiz Blare, DO 03/10/2017, 5:49 AM

## 2017-01-04 ENCOUNTER — Telehealth: Payer: Self-pay | Admitting: General Practice

## 2017-01-04 NOTE — Telephone Encounter (Signed)
-----   Message from Osborne Oman, MD sent at 12/30/2016  7:59 AM EST ----- Patient's 2 hr GTT is abnormal and is consistent with GDM [ ]  GDM education and testing supplies, please book ASAP [ ]  Nutrition consult [ ]  Growth scan around time of diagnosis, if indicated Please call to inform patient of results and recommendations.

## 2017-01-04 NOTE — Telephone Encounter (Signed)
Called patient with Amber Harper for interpreter & informed her of results & recommendations. Patient verbalized understanding and states she can come tomorrow at [facility] for appt. Patient asked if this was dangerous to her or the baby. Told patient it does put her at a higher risk for certain complications so keeping her blood sugars under good control will help with that, which the diabetes educator will talk to her about tomorrow. Asked patient if she has insurance and she states a letter from [facility] for coverage. Told patient we will give her testing supplies at her appt tomorrow as well. Patient verbalized understanding to all & had no questions at this time.

## 2017-01-05 ENCOUNTER — Ambulatory Visit: Payer: Self-pay | Admitting: *Deleted

## 2017-01-05 ENCOUNTER — Encounter: Payer: Self-pay | Attending: Obstetrics and Gynecology | Admitting: *Deleted

## 2017-01-05 DIAGNOSIS — Z3A00 Weeks of gestation of pregnancy not specified: Secondary | ICD-10-CM | POA: Insufficient documentation

## 2017-01-05 DIAGNOSIS — R7309 Other abnormal glucose: Secondary | ICD-10-CM

## 2017-01-05 DIAGNOSIS — O99810 Abnormal glucose complicating pregnancy: Secondary | ICD-10-CM | POA: Insufficient documentation

## 2017-01-05 NOTE — Progress Notes (Signed)
  Patient was seen on 01/05/2017 for Gestational Diabetes self-management . She speaks Spanish, Nutritional therapist was used. She states no history of GDM. EDD 03/22/2017.  Diet history reveals good variety of all food groups and limited intake of sweet drinks. The following learning objectives were met by the patient :   States the definition of Gestational Diabetes  States why dietary management is important in controlling blood glucose  Describes the effects of carbohydrates on blood glucose levels  Demonstrates ability to create a balanced meal plan  Demonstrates carbohydrate counting   States when to check blood glucose levels  Demonstrates proper blood glucose monitoring techniques  States the effect of stress and exercise on blood glucose levels  States the importance of limiting caffeine and abstaining from alcohol and smoking  Plan:  Aim for 3 Carb Choices per meal (45 grams) +/- 1 either way  Aim for 1-2 Carbs per snack Begin reading food labels for Total Carbohydrate of foods Consider  increasing your activity level by walking or other activity daily as tolerated Begin checking BG before breakfast and 2 hours after first bite of breakfast, lunch and dinner as directed by MD  Bring Log Book to every medical appointment   Take medication if directed by MD  Blood glucose monitor given: [employer] # [id number] Exp: 03/01/2018 Blood glucose reading: 123 mg/dl after breakfast  Patient instructed to monitor glucose levels: FBS: 60 - 95 mg/dl 2 hour: <120 mg/dl  Patient received the following handouts: in Spanish  Nutrition Diabetes and Pregnancy  Carbohydrate Counting List  Patient will be seen for follow-up as needed.

## 2017-01-13 ENCOUNTER — Ambulatory Visit (INDEPENDENT_AMBULATORY_CARE_PROVIDER_SITE_OTHER): Payer: Self-pay | Admitting: Obstetrics & Gynecology

## 2017-01-13 DIAGNOSIS — N898 Other specified noninflammatory disorders of vagina: Secondary | ICD-10-CM

## 2017-01-13 DIAGNOSIS — O24415 Gestational diabetes mellitus in pregnancy, controlled by oral hypoglycemic drugs: Secondary | ICD-10-CM

## 2017-01-13 DIAGNOSIS — Z113 Encounter for screening for infections with a predominantly sexual mode of transmission: Secondary | ICD-10-CM

## 2017-01-13 MED ORDER — GLYBURIDE 1.25 MG PO TABS: 1.2500 mg | ORAL_TABLET | Freq: Every day | ORAL | 2 refills | Status: DC

## 2017-01-13 MED FILL — glyBURIDE 1.25 MG TABS: 1.25 | 30 days supply | Qty: 30 | Fill #0

## 2017-01-13 NOTE — Progress Notes (Signed)
Pt stated having vaginal itching/white.

## 2017-01-13 NOTE — Progress Notes (Signed)
   PRENATAL VISIT NOTE  Subjective:  Amber Harper is a 32 y.o. G3P2002 at [redacted]w[redacted]d being seen today for ongoing prenatal care.  She is currently monitored for the following issues for this high-risk pregnancy and has Prolactinoma ([place]); Supervision of high risk pregnancy, antepartum, third trimester; Previous cesarean delivery followed by VBAC; and Gestational diabetes mellitus (GDM), antepartum on their problem list.  Patient reports no complaints.  Contractions: Not present. Vag. Bleeding: None.  Movement: Present. Denies leaking of fluid.   The following portions of the patient's history were reviewed and updated as appropriate: allergies, current medications, past family history, past medical history, past social history, past surgical history and problem list. Problem list updated.  Objective:   Vitals:   01/13/17 0935  BP: (!) 103/58  Pulse: 70  Weight: 130 lb (59 kg)    Fetal Status: Fetal Heart Rate (bpm): 148   Movement: Present     General:  Alert, oriented and cooperative. Patient is in no acute distress.  Skin: Skin is warm and dry. No rash noted.   Cardiovascular: Normal heart rate noted  Respiratory: Normal respiratory effort, no problems with respiration noted  Abdomen: Soft, gravid, appropriate for gestational age.  Pain/Pressure: Present     Pelvic: Cervical exam deferred        Extremities: Normal range of motion.  Edema: Trace  Mental Status:  Normal mood and affect. Normal behavior. Normal judgment and thought content.   Assessment and Plan:  Pregnancy: G3P2002 at [redacted]w[redacted]d  1. Gestational diabetes mellitus (GDM) controlled on oral hypoglycemic drug, antepartum Vaginal itching - Cervicovaginal ancillary only - glyBURIDE (DIABETA) 1.25 MG tablet; Take 1 tablet (1.25 mg total) by mouth at bedtime.  Dispense: 30 tablet; Refill: 2 FBS most 100-110, start HS dose of medication Preterm labor symptoms and general obstetric precautions including but not limited  to vaginal bleeding, contractions, leaking of fluid and fetal movement were reviewed in detail with the patient. Please refer to After Visit Summary for other counseling recommendations.  Return in about 2 weeks (around 01/27/2017) for NST AFI.   Emeterio Reeve, MD

## 2017-01-13 NOTE — Patient Instructions (Signed)
Diagnstico de diabetes mellitus gestacional  (Gestational Diabetes Mellitus, Diagnosis)  La diabetes gestacional (diabetes mellitus gestacional) es una forma de diabetes a corto plazo (temporal) que puede aparecer durante el embarazo. Este cuadro desaparece despus del parto. Puede deberse a uno de estos problemas o a ambos:   El cuerpo no produce la cantidad suficiente de una hormona llamada insulina.   El cuerpo no responde de forma normal a la insulina que produce.  La insulina permite que los ciertos azcares (glucosa) ingresen a las clulas del cuerpo. Esto le proporciona la energa. Cuando se tiene diabetes, la glucosa no puede ingresar a las clulas. Esto produce un aumento del nivel de glucosa en la sangre (hiperglucemia).  Si la diabetes se trata, es posible que ni usted ni el beb se vean afectados. El mdico fijar los objetivos del tratamiento para usted. Generalmente, los resultados de la glucemia deben ser los siguientes:   Despus de no comer durante mucho tiempo (ayunar): 95mg/dl (5,3mmol/l).   Despus de las comidas (posprandial):  ? Una hora despus de una comida: igual o menor que 140mg/dl (7,8mmol/l).  ? Dos horas despus de una comida: igual o menor que 120mg/dl (6,7mmol/l).   Nivel de A1c (hemoglobinaA1c): del 6% al 6,5%.  CUIDADOS EN EL HOGAR  Preguntas para hacerle al mdico  Puede hacer las siguientes preguntas:   Debo reunirme con un instructor para el cuidado de la diabetes?   Dnde puedo encontrar un grupo de apoyo para personas diabticas?   Qu equipos necesitar para cuidarme en casa?   Qu medicamentos para la diabetes necesito? Cundo debo tomarlos?   Con qu frecuencia debo controlarme el nivel de glucosa en la sangre?   A qu nmero puedo llamar si tengo preguntas?   Cundo es la prxima cita con el mdico?  Instrucciones generales   Tome los medicamentos de venta libre y los recetados solamente como se lo haya indicado el mdico.   Mantenga un peso  saludable durante el embarazo.   Concurra a todas las visitas de control como se lo haya indicado el mdico. Esto es importante.  SOLICITE AYUDA SI:   Su nivel de glucosa en la sangre es igual o superior a 240mg/dl (13,3mmol/dl).   Su nivel de glucosa en la sangre es igual o superior a 200mg/dl (11,1mmol/l), y tiene cetonas en la orina.   Ha estado enferma o ha tenido fiebre durante 2o ms das y no mejora.   Si tiene alguno de estos problemas durante ms de 6horas:  ? No puede comer ni beber.  ? Siente malestar estomacal (nuseas).  ? Vomita.  ? La materia fecal es lquida (diarrea).  SOLICITE AYUDA DE INMEDIATO SI:   El nivel de glucosa en la sangre est por debajo de 54mg/dl (3mmol/l).   Est confundida.   Tiene dificultad para hacer lo siguiente:  ? Pensar con claridad.  ? Respirar.   El beb se mueve menos de lo normal.   Tiene los siguientes sntomas:  ? Niveles moderados o altos de cetonas en la orina.  ? Hemorragia vaginal.  ? Secrecin de un lquido fuera de lo comn de la vagina.  ? Contracciones prematuras. Estas pueden causar una sensacin de opresin en el vientre.  Esta informacin no tiene como fin reemplazar el consejo del mdico. Asegrese de hacerle al mdico cualquier pregunta que tenga.  Document Released: 04/13/2015 Document Revised: 04/13/2015 Document Reviewed: 01/23/2015  Elsevier Interactive Patient Education  2018 Elsevier Inc.

## 2017-01-16 LAB — CERVICOVAGINAL ANCILLARY ONLY
BACTERIAL VAGINITIS: NEGATIVE
Candida vaginitis: NEGATIVE
Trichomonas: NEGATIVE

## 2017-01-26 ENCOUNTER — Ambulatory Visit (INDEPENDENT_AMBULATORY_CARE_PROVIDER_SITE_OTHER): Payer: Self-pay | Admitting: Obstetrics and Gynecology

## 2017-01-26 ENCOUNTER — Encounter: Payer: Self-pay | Admitting: Obstetrics and Gynecology

## 2017-01-26 ENCOUNTER — Ambulatory Visit (INDEPENDENT_AMBULATORY_CARE_PROVIDER_SITE_OTHER): Payer: Self-pay | Admitting: *Deleted

## 2017-01-26 ENCOUNTER — Telehealth: Payer: Self-pay | Admitting: General Practice

## 2017-01-26 ENCOUNTER — Ambulatory Visit: Payer: Self-pay

## 2017-01-26 VITALS — BP 103/54 | HR 71 | Wt 137.6 lb

## 2017-01-26 DIAGNOSIS — D352 Benign neoplasm of pituitary gland: Secondary | ICD-10-CM

## 2017-01-26 DIAGNOSIS — O24415 Gestational diabetes mellitus in pregnancy, controlled by oral hypoglycemic drugs: Secondary | ICD-10-CM

## 2017-01-26 DIAGNOSIS — O34219 Maternal care for unspecified type scar from previous cesarean delivery: Secondary | ICD-10-CM

## 2017-01-26 DIAGNOSIS — O0993 Supervision of high risk pregnancy, unspecified, third trimester: Secondary | ICD-10-CM

## 2017-01-26 LAB — POCT URINALYSIS DIP (DEVICE)
BILIRUBIN URINE: NEGATIVE
Glucose, UA: NEGATIVE mg/dL
LEUKOCYTES UA: NEGATIVE
NITRITE: NEGATIVE
Protein, ur: NEGATIVE mg/dL
SPECIFIC GRAVITY, URINE: 1.01 (ref 1.005–1.030)
Urobilinogen, UA: 0.2 mg/dL (ref 0.0–1.0)
pH: 7 (ref 5.0–8.0)

## 2017-01-26 NOTE — Progress Notes (Signed)
Interpreter Lockie Mola present for encounter.  Pt has been taking glyburide for 4 days

## 2017-01-26 NOTE — Progress Notes (Signed)

## 2017-01-26 NOTE — Telephone Encounter (Signed)
Called patient with interpreter to reschedule NST appointment on 02/02/17 @ 2:15pm.  Left message on VM in regards to appointment and asked patient to give our office a call back.

## 2017-01-26 NOTE — Progress Notes (Signed)
Subjective:  Amber Harper is a 32 y.o. G3P2002 at [redacted]w[redacted]d being seen today for ongoing prenatal care.  She is currently monitored for the following issues for this high-risk pregnancy and has Prolactinoma ([place]); Supervision of high risk pregnancy, antepartum, third trimester; Previous cesarean delivery followed by VBAC; and Gestational diabetes mellitus (GDM), antepartum on their problem list.  Patient reports no complaints.  Contractions: Irregular. Vag. Bleeding: None.  Movement: Present. Denies leaking of fluid.   The following portions of the patient's history were reviewed and updated as appropriate: allergies, current medications, past family history, past medical history, past social history, past surgical history and problem list. Problem list updated.  Objective:   Vitals:   01/26/17 1055  BP: (!) 103/54  Pulse: 71  Weight: 137 lb 9.6 oz (62.4 kg)    Fetal Status: Fetal Heart Rate (bpm): NST   Movement: Present     General:  Alert, oriented and cooperative. Patient is in no acute distress.  Skin: Skin is warm and dry. No rash noted.   Cardiovascular: Normal heart rate noted  Respiratory: Normal respiratory effort, no problems with respiration noted  Abdomen: Soft, gravid, appropriate for gestational age. Pain/Pressure: Present     Pelvic:  Cervical exam deferred        Extremities: Normal range of motion.     Mental Status: Normal mood and affect. Normal behavior. Normal judgment and thought content.   Urinalysis: Urine Protein: Negative Urine Glucose: Negative  Assessment and Plan:  Pregnancy: G3P2002 at [redacted]w[redacted]d  1. Supervision of high risk pregnancy in third trimester Stable - [place] MFM OB FOLLOW UP; Future - [place] MFM FETAL BPP WO NON STRESS; Future  2. Gestational diabetes mellitus (GDM) controlled on oral hypoglycemic drug, antepartum CBG's improved and goal range with start of Glyburide BPP 8/10 today ( no breathing) Growth scan next week Continue with  antenatal testing - [place] MFM OB FOLLOW UP; Future - [place] MFM FETAL BPP WO NON STRESS; Future  3. Previous cesarean delivery, antepartum Desires TOLAC, consent signed  4. Prolactinoma ([place]) Stable No meds  Preterm labor symptoms and general obstetric precautions including but not limited to vaginal bleeding, contractions, leaking of fluid and fetal movement were reviewed in detail with the patient. Please refer to After Visit Summary for other counseling recommendations.  Return in about 1 week (around 02/02/2017) for NST/BPP only; 2 wks for NST/BPP and HOB, OB visit.   Chancy Milroy, MD

## 2017-01-27 ENCOUNTER — Other Ambulatory Visit: Payer: Self-pay

## 2017-02-02 ENCOUNTER — Other Ambulatory Visit: Payer: Self-pay | Admitting: Obstetrics and Gynecology

## 2017-02-02 ENCOUNTER — Encounter (HOSPITAL_COMMUNITY): Payer: Self-pay

## 2017-02-02 ENCOUNTER — Other Ambulatory Visit: Payer: Self-pay

## 2017-02-02 ENCOUNTER — Encounter: Payer: Self-pay | Admitting: Obstetrics & Gynecology

## 2017-02-02 ENCOUNTER — Ambulatory Visit (HOSPITAL_COMMUNITY)
Admission: RE | Admit: 2017-02-02 | Discharge: 2017-02-02 | Disposition: A | Discharge status: Home / Self Care | Payer: Self-pay | Source: Ambulatory Visit | Attending: Obstetrics and Gynecology | Admitting: Obstetrics and Gynecology

## 2017-02-02 ENCOUNTER — Encounter: Payer: Self-pay | Admitting: Obstetrics and Gynecology

## 2017-02-02 ENCOUNTER — Ambulatory Visit (INDEPENDENT_AMBULATORY_CARE_PROVIDER_SITE_OTHER): Payer: Self-pay | Admitting: *Deleted

## 2017-02-02 DIAGNOSIS — O34219 Maternal care for unspecified type scar from previous cesarean delivery: Secondary | ICD-10-CM

## 2017-02-02 DIAGNOSIS — Z3A33 33 weeks gestation of pregnancy: Secondary | ICD-10-CM

## 2017-02-02 DIAGNOSIS — Z363 Encounter for antenatal screening for malformations: Secondary | ICD-10-CM

## 2017-02-02 DIAGNOSIS — O24415 Gestational diabetes mellitus in pregnancy, controlled by oral hypoglycemic drugs: Secondary | ICD-10-CM

## 2017-02-02 DIAGNOSIS — O0993 Supervision of high risk pregnancy, unspecified, third trimester: Secondary | ICD-10-CM

## 2017-02-02 NOTE — Progress Notes (Signed)
[place] for growth and BPP done today. Interpreter Estill Bamberg present for encounter.

## 2017-02-09 ENCOUNTER — Ambulatory Visit (INDEPENDENT_AMBULATORY_CARE_PROVIDER_SITE_OTHER): Payer: Self-pay | Admitting: Obstetrics and Gynecology

## 2017-02-09 ENCOUNTER — Ambulatory Visit (INDEPENDENT_AMBULATORY_CARE_PROVIDER_SITE_OTHER): Payer: Self-pay | Admitting: *Deleted

## 2017-02-09 ENCOUNTER — Ambulatory Visit: Payer: Self-pay

## 2017-02-09 VITALS — BP 101/53 | HR 81 | Wt 138.1 lb

## 2017-02-09 DIAGNOSIS — O24415 Gestational diabetes mellitus in pregnancy, controlled by oral hypoglycemic drugs: Secondary | ICD-10-CM

## 2017-02-09 DIAGNOSIS — O0993 Supervision of high risk pregnancy, unspecified, third trimester: Secondary | ICD-10-CM

## 2017-02-09 DIAGNOSIS — O34219 Maternal care for unspecified type scar from previous cesarean delivery: Secondary | ICD-10-CM

## 2017-02-09 NOTE — Progress Notes (Signed)
Live interpreter present for encounter.  [place] for growth and BPP done 1/31.

## 2017-02-09 NOTE — Patient Instructions (Signed)

## 2017-02-09 NOTE — Progress Notes (Signed)
Subjective:  Amber Harper is a 32 y.o. G3P2002 at [redacted]w[redacted]d being seen today for ongoing prenatal care.  She is currently monitored for the following issues for this high-risk pregnancy and has Prolactinoma ([place]); Supervision of high risk pregnancy, antepartum, third trimester; Previous cesarean delivery followed by VBAC; and Gestational diabetes mellitus (GDM), antepartum on their problem list.  Patient reports backache.  Contractions: Irregular. Vag. Bleeding: None.  Movement: Present. Denies leaking of fluid.   The following portions of the patient's history were reviewed and updated as appropriate: allergies, current medications, past family history, past medical history, past social history, past surgical history and problem list. Problem list updated.  Objective:   Vitals:   02/09/17 1436  BP: (!) 101/53  Pulse: 81  Weight: 138 lb 1.6 oz (62.6 kg)    Fetal Status: Fetal Heart Rate (bpm): NST   Movement: Present     General:  Alert, oriented and cooperative. Patient is in no acute distress.  Skin: Skin is warm and dry. No rash noted.   Cardiovascular: Normal heart rate noted  Respiratory: Normal respiratory effort, no problems with respiration noted  Abdomen: Soft, gravid, appropriate for gestational age. Pain/Pressure: Present     Pelvic:  Cervical exam deferred        Extremities: Normal range of motion.     Mental Status: Normal mood and affect. Normal behavior. Normal judgment and thought content.   Urinalysis:      Assessment and Plan:  Pregnancy: G3P2002 at [redacted]w[redacted]d  1. Supervision of high risk pregnancy in third trimester Stable  2. Gestational diabetes mellitus (GDM) controlled on oral hypoglycemic drug, antepartum CBG's in goal range for most part Elevated CBG's related to diet choices Continue with [place]ab[place] Growth scan 02/02/17 68% Continue with antenatal testing  3. Previous cesarean delivery, antepartum VBAC consent signed  Preterm labor symptoms and  general obstetric precautions including but not limited to vaginal bleeding, contractions, leaking of fluid and fetal movement were reviewed in detail with the patient. Please refer to After Visit Summary for other counseling recommendations.  Return in about 1 week (around 02/16/2017) for weekly as scheduled.   Chancy Milroy, MD

## 2017-02-09 NOTE — Progress Notes (Signed)

## 2017-02-10 ENCOUNTER — Ambulatory Visit: Payer: Self-pay | Attending: Nurse Practitioner

## 2017-02-16 ENCOUNTER — Ambulatory Visit: Payer: Self-pay

## 2017-02-16 ENCOUNTER — Ambulatory Visit (INDEPENDENT_AMBULATORY_CARE_PROVIDER_SITE_OTHER): Payer: Self-pay | Admitting: Obstetrics and Gynecology

## 2017-02-16 ENCOUNTER — Ambulatory Visit (INDEPENDENT_AMBULATORY_CARE_PROVIDER_SITE_OTHER): Payer: Self-pay | Admitting: *Deleted

## 2017-02-16 VITALS — BP 100/58 | HR 76 | Wt 137.5 lb

## 2017-02-16 DIAGNOSIS — O24415 Gestational diabetes mellitus in pregnancy, controlled by oral hypoglycemic drugs: Secondary | ICD-10-CM

## 2017-02-16 DIAGNOSIS — D352 Benign neoplasm of pituitary gland: Secondary | ICD-10-CM

## 2017-02-16 DIAGNOSIS — O34219 Maternal care for unspecified type scar from previous cesarean delivery: Secondary | ICD-10-CM

## 2017-02-16 DIAGNOSIS — Z789 Other specified health status: Secondary | ICD-10-CM

## 2017-02-16 DIAGNOSIS — O0993 Supervision of high risk pregnancy, unspecified, third trimester: Secondary | ICD-10-CM

## 2017-02-16 DIAGNOSIS — O329XX0 Maternal care for malpresentation of fetus, unspecified, not applicable or unspecified: Secondary | ICD-10-CM

## 2017-02-16 MED ORDER — GLYBURIDE 1.25 MG PO TABS: ORAL_TABLET | ORAL | 0 refills | Status: DC

## 2017-02-16 MED FILL — glyBURIDE 1.25 MG TABS: 1.25 | 15 days supply | Qty: 30 | Fill #0

## 2017-02-16 NOTE — Progress Notes (Signed)

## 2017-02-16 NOTE — Progress Notes (Signed)
Prenatal Visit Note Date: 02/16/2017 Clinic: [facility]  Subjective:  Amber Harper is a 32 y.o. G3P2002 at [redacted]w[redacted]d being seen today for ongoing prenatal care.  She is currently monitored for the following issues for this high-risk pregnancy and has Prolactinoma ([place]); Supervision of high risk pregnancy, antepartum, third trimester; Previous cesarean delivery followed by VBAC; and Gestational diabetes mellitus (GDM) controlled on oral hypoglycemic drug on their problem list.  Patient reports no complaints.   Contractions: Not present. Vag. Bleeding: None.  Movement: Present. Denies leaking of fluid.   The following portions of the patient's history were reviewed and updated as appropriate: allergies, current medications, past family history, past medical history, past social history, past surgical history and problem list. Problem list updated.  Objective:   Vitals:   02/16/17 1455  BP: (!) 100/58  Pulse: 76  Weight: 137 lb 8 oz (62.4 kg)    Fetal Status: Fetal Heart Rate (bpm): rNST   Movement: Present     General:  Alert, oriented and cooperative. Patient is in no acute distress.  Skin: Skin is warm and dry. No rash noted.   Cardiovascular: Normal heart rate noted  Respiratory: Normal respiratory effort, no problems with respiration noted  Abdomen: Soft, gravid, appropriate for gestational age. Pain/Pressure: Present     Pelvic:  Cervical exam deferred        Extremities: Normal range of motion.  Edema: None  Mental Status: Normal mood and affect. Normal behavior. Normal judgment and thought content.   Urinalysis:      Assessment and Plan:  Pregnancy: G3P2002 at [redacted]w[redacted]d  1. Supervision of high risk pregnancy, antepartum, third trimester Routine care. D/w pt re: BC nv. gbs nv.  - [place] MFM OB FOLLOW UP; Future - US Fetal BPP W/O Non Stress; Future  2. Prolactinoma ([place]) Followed by [facility] endo. To follow up PP  3. Gestational diabetes mellitus (GDM)  in third trimester controlled on oral hypoglycemic drug On glyb 1.25 qhs. Has elevated 2h PP lunch and dinner the 120s-130s and a some but not as much after breakfast. Recommend 1.25 with breakfast and dinner. bpp 8/10 (-2 for breathing) today. Rpt growth in 2wks - [place] MFM OB FOLLOW UP; Future - US Fetal BPP W/O Non Stress; Future  4. Previous cesarean delivery followed by [facility] tolac consent form signed already.   5. Malpresentation Transverse today. F/u next week and d/w her re: ECV if still non cephalic   Interpreter used.   Preterm labor symptoms and general obstetric precautions including but not limited to vaginal bleeding, contractions, leaking of fluid and fetal movement were reviewed in detail with the patient. Please refer to After Visit Summary for other counseling recommendations.  Return in about 1 week (around 02/23/2017) for 1wk hrob, nst/bpp. 2wk hrob, nst (on same day as her bpp).   Aletha Halim, MD    1 tab qhs

## 2017-02-17 DIAGNOSIS — O329XX0 Maternal care for malpresentation of fetus, unspecified, not applicable or unspecified: Secondary | ICD-10-CM | POA: Insufficient documentation

## 2017-02-23 ENCOUNTER — Ambulatory Visit (INDEPENDENT_AMBULATORY_CARE_PROVIDER_SITE_OTHER): Payer: Self-pay | Admitting: *Deleted

## 2017-02-23 ENCOUNTER — Ambulatory Visit (INDEPENDENT_AMBULATORY_CARE_PROVIDER_SITE_OTHER): Payer: Self-pay | Admitting: Obstetrics & Gynecology

## 2017-02-23 ENCOUNTER — Ambulatory Visit: Payer: Self-pay

## 2017-02-23 VITALS — BP 96/57 | HR 72 | Wt 137.9 lb

## 2017-02-23 DIAGNOSIS — O24415 Gestational diabetes mellitus in pregnancy, controlled by oral hypoglycemic drugs: Secondary | ICD-10-CM

## 2017-02-23 DIAGNOSIS — O34219 Maternal care for unspecified type scar from previous cesarean delivery: Secondary | ICD-10-CM

## 2017-02-23 DIAGNOSIS — Z113 Encounter for screening for infections with a predominantly sexual mode of transmission: Secondary | ICD-10-CM

## 2017-02-23 DIAGNOSIS — O0993 Supervision of high risk pregnancy, unspecified, third trimester: Secondary | ICD-10-CM

## 2017-02-23 LAB — POCT URINALYSIS DIP (DEVICE)
BILIRUBIN URINE: NEGATIVE
Glucose, UA: NEGATIVE mg/dL
Hgb urine dipstick: NEGATIVE
KETONES UR: NEGATIVE mg/dL
LEUKOCYTES UA: NEGATIVE
NITRITE: NEGATIVE
PH: 7 (ref 5.0–8.0)
Protein, ur: NEGATIVE mg/dL
Specific Gravity, Urine: 1.01 (ref 1.005–1.030)
Urobilinogen, UA: 0.2 mg/dL (ref 0.0–1.0)

## 2017-02-23 NOTE — Progress Notes (Signed)
Interpreter Meryl Crutch present for encounter. Pt states she took glyburide twice daily as instructed on 2/14 for only 4 days. She went back to taking it once daily on 2/19 because she observed that her blood sugars were actually higher when she took it twice.  [place] for growth and BPP scheduled on 2/28.     PRENATAL VISIT NOTE  Subjective:  Amber Harper is a 32 y.o. G3P2002 at [redacted]w[redacted]d being seen today for ongoing prenatal care.  She is currently monitored for the following issues for this high-risk pregnancy and has Prolactinoma ([place]); Supervision of high risk pregnancy, antepartum, third trimester; Previous cesarean delivery followed by VBAC; Gestational diabetes mellitus (GDM) controlled on oral hypoglycemic drug; Language barrier; and Malpresentation before onset of labor on their problem list.  Patient reports no complaints.  Contractions: Not present. Vag. Bleeding: None.  Movement: Present. Denies leaking of fluid.   The following portions of the patient's history were reviewed and updated as appropriate: allergies, current medications, past family history, past medical history, past social history, past surgical history and problem list. Problem list updated.  Objective:   Vitals:   02/23/17 1426  BP: (!) 96/57  Pulse: 72  Weight: 137 lb 14.4 oz (62.6 kg)    Fetal Status: Fetal Heart Rate (bpm): NST   Movement: Present  Presentation: Transverse  General:  Alert, oriented and cooperative. Patient is in no acute distress.  Skin: Skin is warm and dry. No rash noted.   Cardiovascular: Normal heart rate noted  Respiratory: Normal respiratory effort, no problems with respiration noted  Abdomen: Soft, gravid, appropriate for gestational age.  Pain/Pressure: Present     Pelvic: Cervical exam deferred        Extremities: Normal range of motion.  Edema: None  Mental Status:  Normal mood and affect. Normal behavior. Normal judgment and thought content.   Assessment and Plan:   Pregnancy: G3P2002 at [redacted]w[redacted]d  1. Supervision of high risk pregnancy, antepartum, third trimester - GC/Chlamydia probe amp ([place])not at [facility] - Strep Gp B NAA  2. Gestational diabetes mellitus (GDM) in third trimester controlled on oral hypoglycemic drug Only taking glyburide at night for past 3 days.  All values nml except one.  Continue current regimen.  3. Previous cesarean delivery followed by VBAC Wants TOLAC.  Is transverse back up.  Getting [place] in one week.  If still transverse, will call reprot to MD and plan for version.   Term labor symptoms and general obstetric precautions including but not limited to vaginal bleeding, contractions, leaking of fluid and fetal movement were reviewed in detail with the patient. Please refer to After Visit Summary for other counseling recommendations.  Return in about 7 days (around 03/02/2017) for as scheduled.   Silas Sacramento, MD

## 2017-02-23 NOTE — Progress Notes (Signed)

## 2017-02-24 LAB — GC/CHLAMYDIA PROBE AMP (~~LOC~~) NOT AT ARMC
CHLAMYDIA, DNA PROBE: NEGATIVE
NEISSERIA GONORRHEA: NEGATIVE

## 2017-02-25 LAB — STREP GP B NAA: Strep Gp B NAA: NEGATIVE

## 2017-03-02 ENCOUNTER — Ambulatory Visit (INDEPENDENT_AMBULATORY_CARE_PROVIDER_SITE_OTHER): Payer: Self-pay | Admitting: *Deleted

## 2017-03-02 ENCOUNTER — Ambulatory Visit (INDEPENDENT_AMBULATORY_CARE_PROVIDER_SITE_OTHER): Payer: Self-pay | Admitting: Obstetrics & Gynecology

## 2017-03-02 ENCOUNTER — Encounter (HOSPITAL_COMMUNITY): Payer: Self-pay

## 2017-03-02 ENCOUNTER — Other Ambulatory Visit: Payer: Self-pay | Admitting: Obstetrics and Gynecology

## 2017-03-02 ENCOUNTER — Ambulatory Visit (HOSPITAL_COMMUNITY)
Admission: RE | Admit: 2017-03-02 | Discharge: 2017-03-02 | Disposition: A | Discharge status: Home / Self Care | Payer: Self-pay | Source: Ambulatory Visit | Attending: Obstetrics and Gynecology | Admitting: Obstetrics and Gynecology

## 2017-03-02 VITALS — BP 103/55 | HR 79 | Wt 137.6 lb

## 2017-03-02 DIAGNOSIS — O24415 Gestational diabetes mellitus in pregnancy, controlled by oral hypoglycemic drugs: Secondary | ICD-10-CM | POA: Insufficient documentation

## 2017-03-02 DIAGNOSIS — Z3A37 37 weeks gestation of pregnancy: Secondary | ICD-10-CM

## 2017-03-02 DIAGNOSIS — O0993 Supervision of high risk pregnancy, unspecified, third trimester: Secondary | ICD-10-CM

## 2017-03-02 DIAGNOSIS — O34219 Maternal care for unspecified type scar from previous cesarean delivery: Secondary | ICD-10-CM

## 2017-03-02 NOTE — Patient Instructions (Signed)
Vaginal Birth After Cesarean Delivery Vaginal birth after cesarean delivery (VBAC) is giving birth vaginally after previously delivering a baby by a cesarean. In the past, if a woman had a cesarean delivery, all births afterward would be done by cesarean delivery. This is no longer true. It can be safe for the mother to try a vaginal delivery after having a cesarean delivery. It is important to discuss VBAC with your health care provider early in the pregnancy so you can understand the risks, benefits, and options. It will give you time to decide what is best in your particular case. The final decision about whether to have a VBAC or repeat cesarean delivery should be between you and your health care provider. Any changes in your health or your baby's health during your pregnancy may make it necessary to change your initial decision about VBAC. Women who plan to have a VBAC should check with their health care provider to be sure that:  The previous cesarean delivery was done with a low transverse uterine cut (incision) (not a vertical classical incision).  The birth canal is big enough for the baby.  There were no other operations on the uterus.  An electronic fetal monitor (EFM) will be on at all times during labor.  An operating room will be available and ready in case an emergency cesarean delivery is needed.  A health care provider and surgical nursing staff will be available at all times during labor to be ready to do an emergency delivery cesarean if necessary.  An anesthesiologist will be present in case an emergency cesarean delivery is needed.  The nursery is prepared and has adequate personnel and necessary equipment available to care for the baby in case of an emergency cesarean delivery. Benefits of VBAC  Shorter stay in the hospital.  Avoidance of risks associated with cesarean delivery, such as: ? Surgical complications, such as opening of the incision or hernia in the  incision. ? Injury to other organs. ? Fever. This can occur if an infection develops after surgery. It can also occur as a reaction to the medicine given to make you numb during the surgery.  Less blood loss and need for blood transfusions.  Lower risk of blood clots and infection.  Shorter recovery.  Decreased risk for having to remove the uterus (hysterectomy).  Decreased risk for the placenta to completely or partially cover the opening of the uterus (placenta previa) with a future pregnancy.  Decrease risk in future labor and delivery. Risks of a VBAC  Tearing (rupture) of the uterus. This is occurs in less than 1% of VBACs. The risk of this happening is higher if: ? Steps are taken to begin the labor process (induce labor) or stimulate or strengthen contractions (augment labor). ? Medicine is used to soften (ripen) the cervix.  Having to remove the uterus (hysterectomy) if it ruptures. VBAC should not be done if:  The previous cesarean delivery was done with a vertical (classical) or T-shaped incision or you do not know what kind of incision was made.  You had a ruptured uterus.  You have had certain types of surgery on your uterus, such as removal of uterine fibroids. Ask your health care provider about other types of surgeries that prevent you from having a VBAC.  You have certain medical or childbirth (obstetrical) problems.  There are problems with the baby.  You have had two previous cesarean deliveries and no vaginal deliveries. Other facts to know about VBAC:  It   is safe to have an epidural anesthetic with VBAC.  It is safe to turn the baby from a breech position (attempt an external cephalic version).  It is safe to try a VBAC with twins.  VBAC may not be successful if your baby weights 8.8 lb (4 kg) or more. However, weight predictions are not always accurate and should not be used alone to decide if VBAC is right for you.  There is an increased failure rate  if the time between the cesarean delivery and VBAC is less than 19 months.  Your health care provider may advise against a VBAC if you have preeclampsia (high blood pressure, protein in the urine, and swelling of face and extremities).  VBAC is often successful if you previously gave birth vaginally.  VBAC is often successful when the labor starts spontaneously before the due date.  Delivering a baby through a VBAC is similar to having a normal spontaneous vaginal delivery. This information is not intended to replace advice given to you by your health care provider. Make sure you discuss any questions you have with your health care provider. Document Released: 06/12/2006 Document Revised: 05/28/2015 Document Reviewed: 07/19/2012 Elsevier Interactive Patient Education  2018 [employer]vier Inc[employer]  

## 2017-03-02 NOTE — Progress Notes (Signed)
NST reactive, IOL 39 weeks   PRENATAL VISIT NOTE  Subjective:  Amber Harper is a 32 y.o. G3P2002 at [redacted]w[redacted]d being seen today for ongoing prenatal care.  She is currently monitored for the following issues for this high-risk pregnancy and has Prolactinoma ([place]); Supervision of high risk pregnancy, antepartum, third trimester; Previous cesarean delivery followed by VBAC; Gestational diabetes mellitus (GDM) controlled on oral hypoglycemic drug; Language barrier; and Malpresentation before onset of labor on their problem list.  Patient reports no complaints.  Contractions: Not present. Vag. Bleeding: None.  Movement: Present. Denies leaking of fluid.   The following portions of the patient's history were reviewed and updated as appropriate: allergies, current medications, past family history, past medical history, past social history, past surgical history and problem list. Problem list updated.  Objective:   Vitals:   03/02/17 0826  BP: (!) 103/55  Pulse: 79  Weight: 137 lb 9.6 oz (62.4 kg)    Fetal Status: Fetal Heart Rate (bpm): NST   Movement: Present     General:  Alert, oriented and cooperative. Patient is in no acute distress.  Skin: Skin is warm and dry. No rash noted.   Cardiovascular: Normal heart rate noted  Respiratory: Normal respiratory effort, no problems with respiration noted  Abdomen: Soft, gravid, appropriate for gestational age.  Pain/Pressure: Present     Pelvic: Cervical exam deferred        Extremities: Normal range of motion.  Edema: None  Mental Status:  Normal mood and affect. Normal behavior. Normal judgment and thought content.   Assessment and Plan:  Pregnancy: G3P2002 at [redacted]w[redacted]d  1. Supervision of high risk pregnancy, antepartum, third trimester   2. Gestational diabetes mellitus (GDM) in third trimester controlled on oral hypoglycemic drug Good control  3. Previous cesarean delivery followed by VBAC Plans TOLAC  Term labor symptoms and  general obstetric precautions including but not limited to vaginal bleeding, contractions, leaking of fluid and fetal movement were reviewed in detail with the patient. Please refer to After Visit Summary for other counseling recommendations.  Return in about 1 week (around 03/09/2017) for NST/BPP and HOB.   Emeterio Reeve, MD

## 2017-03-02 NOTE — Progress Notes (Signed)
Interpreter Amber Harper present for encounter.  [place] for growth /BPP today @ 1300

## 2017-03-06 ENCOUNTER — Encounter (HOSPITAL_COMMUNITY): Payer: Self-pay | Admitting: *Deleted

## 2017-03-06 ENCOUNTER — Telehealth (HOSPITAL_COMMUNITY): Payer: Self-pay | Admitting: *Deleted

## 2017-03-06 ENCOUNTER — Encounter: Payer: Self-pay | Admitting: *Deleted

## 2017-03-06 NOTE — Telephone Encounter (Signed)
Preadmission screen Interpreter number [phone]

## 2017-03-06 NOTE — Telephone Encounter (Signed)
During preadmission call pt c/o brown discharge. Denies leaking of fluid or bleeding.  Baby is moving frequently.  Instructed her she needed to come to the hospital for leakage of fluid, bleeding, contractions or decreased fetal movement.  Otherwise she could wait until her visit on Friday.

## 2017-03-09 ENCOUNTER — Encounter (HOSPITAL_COMMUNITY): Payer: Self-pay | Admitting: *Deleted

## 2017-03-09 ENCOUNTER — Other Ambulatory Visit: Payer: Self-pay

## 2017-03-09 ENCOUNTER — Inpatient Hospital Stay (HOSPITAL_COMMUNITY): Payer: Medicaid Other | Admitting: Anesthesiology

## 2017-03-09 ENCOUNTER — Inpatient Hospital Stay (HOSPITAL_COMMUNITY)
Admission: AD | Admit: 2017-03-09 | Discharge: 2017-03-11 | DRG: 805 | Disposition: A | Discharge status: Home / Self Care | Payer: Medicaid Other | Source: Ambulatory Visit | Attending: Obstetrics & Gynecology | Admitting: Obstetrics & Gynecology

## 2017-03-09 ENCOUNTER — Encounter (HOSPITAL_COMMUNITY): Payer: Self-pay

## 2017-03-09 ENCOUNTER — Inpatient Hospital Stay (HOSPITAL_COMMUNITY)
Admission: AD | Admit: 2017-03-09 | Discharge: 2017-03-09 | Disposition: A | Discharge status: Home / Self Care | Payer: Medicaid Other | Source: Ambulatory Visit | Attending: Obstetrics & Gynecology | Admitting: Obstetrics & Gynecology

## 2017-03-09 DIAGNOSIS — O329XX0 Maternal care for malpresentation of fetus, unspecified, not applicable or unspecified: Secondary | ICD-10-CM | POA: Diagnosis present

## 2017-03-09 DIAGNOSIS — Z3A38 38 weeks gestation of pregnancy: Secondary | ICD-10-CM | POA: Insufficient documentation

## 2017-03-09 DIAGNOSIS — O24425 Gestational diabetes mellitus in childbirth, controlled by oral hypoglycemic drugs: Secondary | ICD-10-CM | POA: Diagnosis present

## 2017-03-09 DIAGNOSIS — D649 Anemia, unspecified: Secondary | ICD-10-CM | POA: Diagnosis present

## 2017-03-09 DIAGNOSIS — O471 False labor at or after 37 completed weeks of gestation: Secondary | ICD-10-CM

## 2017-03-09 DIAGNOSIS — O34219 Maternal care for unspecified type scar from previous cesarean delivery: Secondary | ICD-10-CM | POA: Diagnosis present

## 2017-03-09 DIAGNOSIS — O43123 Velamentous insertion of umbilical cord, third trimester: Secondary | ICD-10-CM | POA: Diagnosis present

## 2017-03-09 DIAGNOSIS — O9902 Anemia complicating childbirth: Secondary | ICD-10-CM | POA: Diagnosis present

## 2017-03-09 DIAGNOSIS — Z3490 Encounter for supervision of normal pregnancy, unspecified, unspecified trimester: Secondary | ICD-10-CM

## 2017-03-09 DIAGNOSIS — Z3483 Encounter for supervision of other normal pregnancy, third trimester: Secondary | ICD-10-CM | POA: Diagnosis present

## 2017-03-09 DIAGNOSIS — O411230 Chorioamnionitis, third trimester, not applicable or unspecified: Secondary | ICD-10-CM | POA: Diagnosis present

## 2017-03-09 DIAGNOSIS — O24415 Gestational diabetes mellitus in pregnancy, controlled by oral hypoglycemic drugs: Secondary | ICD-10-CM | POA: Diagnosis present

## 2017-03-09 DIAGNOSIS — O0993 Supervision of high risk pregnancy, unspecified, third trimester: Secondary | ICD-10-CM

## 2017-03-09 DIAGNOSIS — O479 False labor, unspecified: Secondary | ICD-10-CM

## 2017-03-09 LAB — CBC
HCT: 35.9 % — ABNORMAL LOW (ref 36.0–46.0)
Hemoglobin: 13 g/dL (ref 12.0–15.0)
MCH: 32.2 pg (ref 26.0–34.0)
MCHC: 36.2 g/dL — ABNORMAL HIGH (ref 30.0–36.0)
MCV: 88.9 fL (ref 78.0–100.0)
Platelets: 153 K/uL (ref 150–400)
RBC: 4.04 MIL/uL (ref 3.87–5.11)
RDW: 13.5 % (ref 11.5–15.5)
WBC: 10.2 K/uL (ref 4.0–10.5)

## 2017-03-09 LAB — GLUCOSE, CAPILLARY
Glucose-Capillary: 76 mg/dL (ref 65–99)
Glucose-Capillary: 89 mg/dL (ref 65–99)

## 2017-03-09 LAB — ABO/RH: ABO/RH(D): O POS

## 2017-03-09 MED ORDER — OXYTOCIN 40 UNITS IN LACTATED RINGERS INFUSION - SIMPLE MED
2.5000 [IU]/h | INTRAVENOUS | Status: DC
  Filled 2017-03-09 (×2): qty 1000

## 2017-03-09 MED ORDER — OXYTOCIN BOLUS FROM INFUSION
500.0000 mL | Freq: Once | INTRAVENOUS | Status: AC
  Administered 2017-03-10: 500 mL via INTRAVENOUS

## 2017-03-09 MED ORDER — PHENYLEPHRINE 40 MCG/ML (10ML) SYRINGE FOR IV PUSH (FOR BLOOD PRESSURE SUPPORT)
80.0000 ug | PREFILLED_SYRINGE | INTRAVENOUS | Status: DC | PRN
  Filled 2017-03-09: qty 10
  Filled 2017-03-09: qty 5

## 2017-03-09 MED ORDER — LACTATED RINGERS IV SOLN
INTRAVENOUS | Status: DC
  Administered 2017-03-09: 18:00:00 via INTRAVENOUS

## 2017-03-09 MED ORDER — LACTATED RINGERS IV SOLN: 500.0000 mL | INTRAVENOUS | Status: DC | PRN

## 2017-03-09 MED ORDER — LACTATED RINGERS IV SOLN: 500.0000 mL | Freq: Once | INTRAVENOUS | Status: DC

## 2017-03-09 MED ORDER — LACTATED RINGERS IV BOLUS (SEPSIS)
1000.0000 mL | Freq: Once | INTRAVENOUS | Status: AC
  Administered 2017-03-09: 1000 mL via INTRAVENOUS

## 2017-03-09 MED ORDER — ONDANSETRON HCL 4 MG/2ML IJ SOLN: 4.0000 mg | Freq: Four times a day (QID) | INTRAMUSCULAR | Status: DC | PRN

## 2017-03-09 MED ORDER — EPHEDRINE 5 MG/ML INJ
10.0000 mg | INTRAVENOUS | Status: DC | PRN
  Filled 2017-03-09: qty 2

## 2017-03-09 MED ORDER — LIDOCAINE HCL (PF) 1 % IJ SOLN
30.0000 mL | INTRAMUSCULAR | Status: DC | PRN
  Filled 2017-03-09: qty 30

## 2017-03-09 MED ORDER — EPHEDRINE 5 MG/ML INJ: 10.0000 mg | INTRAVENOUS | Status: DC | PRN

## 2017-03-09 MED ORDER — SOD CITRATE-CITRIC ACID 500-334 MG/5ML PO SOLN: 30.0000 mL | ORAL | Status: DC | PRN

## 2017-03-09 MED ORDER — LIDOCAINE HCL (PF) 1 % IJ SOLN
INTRAMUSCULAR | Status: DC | PRN
  Administered 2017-03-09: 4 mL via EPIDURAL

## 2017-03-09 MED ORDER — PHENYLEPHRINE 40 MCG/ML (10ML) SYRINGE FOR IV PUSH (FOR BLOOD PRESSURE SUPPORT): 80.0000 ug | PREFILLED_SYRINGE | INTRAVENOUS | Status: DC | PRN

## 2017-03-09 MED ORDER — FENTANYL CITRATE (PF) 100 MCG/2ML IJ SOLN: 100.0000 ug | INTRAMUSCULAR | Status: DC | PRN

## 2017-03-09 MED ORDER — FENTANYL 2.5 MCG/ML BUPIVACAINE 1/10 % EPIDURAL INFUSION (WH - ANES)
14.0000 mL/h | INTRAMUSCULAR | Status: DC | PRN
  Administered 2017-03-09 – 2017-03-10 (×2): 14 mL/h via EPIDURAL
  Filled 2017-03-09 (×2): qty 100

## 2017-03-09 MED ORDER — ACETAMINOPHEN 325 MG PO TABS
650.0000 mg | ORAL_TABLET | ORAL | Status: DC | PRN
  Administered 2017-03-10: 650 mg via ORAL
  Filled 2017-03-09: qty 2

## 2017-03-09 MED ORDER — DIPHENHYDRAMINE HCL 50 MG/ML IJ SOLN: 12.5000 mg | INTRAMUSCULAR | Status: DC | PRN

## 2017-03-09 MED ORDER — PHENYLEPHRINE 40 MCG/ML (10ML) SYRINGE FOR IV PUSH (FOR BLOOD PRESSURE SUPPORT)
80.0000 ug | PREFILLED_SYRINGE | INTRAVENOUS | Status: DC | PRN
  Filled 2017-03-09: qty 5

## 2017-03-09 NOTE — MAU Note (Signed)
I have communicated with Hansel Feinstein CNM and reviewed vital signs:  Vitals:   03/09/17 0530 03/09/17 0531  BP: 105/65 105/65  Pulse: 88 88  Resp:    Temp:      Vaginal exam:  Dilation: 1.5 Effacement (%): 60 Exam by:: Loraine Grip, RN,   Also reviewed contraction pattern and that non-stress test is reactive.  It has been documented that patient is contracting every 4-7 minutes with no cervical change over 1 hour not indicating active labor.  Patient denies any other complaints.  Based on this report provider has given order for discharge.  A discharge order and diagnosis entered by a provider. Fetal tracing reviewed by CNM. Labor discharge instructions reviewed with patient.

## 2017-03-09 NOTE — Anesthesia Procedure Notes (Signed)
Epidural Patient location during procedure: OB Start time: 03/09/2017 7:10 PM End time: 03/09/2017 7:18 PM  Staffing Anesthesiologist: Effie Berkshire, MD Performed: anesthesiologist   Preanesthetic Checklist Completed: patient identified, site marked, surgical consent, pre-op evaluation, timeout performed, IV checked, risks and benefits discussed and monitors and equipment checked  Epidural Patient position: sitting Prep: ChloraPrep Patient monitoring: heart rate, continuous pulse ox and blood pressure Approach: midline Location: L3-L4 Injection technique: LOR saline  Needle:  Needle type: Tuohy  Needle gauge: 17 G Needle length: 9 cm Catheter type: closed end flexible Catheter size: 20 Guage Test dose: negative and 1.5% lidocaine  Assessment Events: blood not aspirated, injection not painful, no injection resistance and no paresthesia  Additional Notes LOR @ 4.5  Patient identified. Risks/Benefits/Options discussed with patient including but not limited to bleeding, infection, nerve damage, paralysis, failed block, incomplete pain control, headache, blood pressure changes, nausea, vomiting, reactions to medications, itching and postpartum back pain. Confirmed with bedside nurse the patient's most recent platelet count. Confirmed with patient that they are not currently taking any anticoagulation, have any bleeding history or any family history of bleeding disorders. Patient expressed understanding and wished to proceed. All questions were answered. Sterile technique was used throughout the entire procedure. Please see nursing notes for vital signs. Test dose was given through epidural catheter and negative prior to continuing to dose epidural or start infusion. Warning signs of high block given to the patient including shortness of breath, tingling/numbness in hands, complete motor block, or any concerning symptoms with instructions to call for help. Patient was given instructions on  fall risk and not to get out of bed. All questions and concerns addressed with instructions to call with any issues or inadequate analgesia.    Reason for block:procedure for pain

## 2017-03-09 NOTE — Progress Notes (Signed)
Labor Progress Note  Amber Harper is a 32 y.o. G3P2002 at [redacted]w[redacted]d  admitted for active labor  S: Doing well. Comfortable with epidural.   O:  BP 103/63   Pulse 84   Temp 98.3 F (36.8 C) (Oral)   Resp 18   Ht 4\' 7"  (1.397 m)   Wt 62.1 kg (137 lb)   LMP 06/15/2016 (Approximate)   SpO2 100%   BMI 31.84 kg/m   No intake/output data recorded.  FHT:  FHR: 130 bpm, variability: moderate,  accelerations:  Present,  decelerations:  Absent UC:   regular, every 2-4 minutes SVE:   Dilation: 7 Effacement (%): 80 Station: -2 Exam by:: Dr. Gerarda Fraction AROM: clear at  2038  Labs: Lab Results  Component Value Date   WBC 10.2 03/09/2017   HGB 13.0 03/09/2017   HCT 35.9 (L) 03/09/2017   MCV 88.9 03/09/2017   PLT 153 03/09/2017    Assessment / Plan: 32 y.o. G3P2002 [redacted]w[redacted]d in active labor Spontaneous labor, progressing normally  Labor: Progressing normally, s/p AROM. No need for further augmentation at this time. Fetal Wellbeing:  Category I Pain Control:  Epidural Anticipated MOD:  NSVD  Expectant management   Luiz Blare, DO OB Fellow [facility], [facility]

## 2017-03-09 NOTE — H&P (Addendum)
OBSTETRIC ADMISSION HISTORY AND PHYSICAL  Amber Harper is a 32 y.o. female G3P2002 with IUP at [redacted]w[redacted]d by LMP presenting for SOL. She reports +FMs, No LOF, no VB, no blurry vision, headaches or peripheral edema, and RUQ pain.  She plans on breast feeding. She requests condoms for birth control until she can discuss options with her spouse. 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 Last seen 02/03/17. Previously on Cabergoline off and on until 09/2015. Stopped due to side effects. 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 Similar to 2014 MRI. Given pregnancy and desire for breast feeding - discussed monitoring clinically for symptoms of growth (headache, vision changes) and holding off medication on breast feeding was over.    Gestational diabetes well controlled on Glyburide  Prior c-section followed by VBAC  Past Medical History: Past Medical History:  Diagnosis Date  . Arthritis   . Constipation 07/04/2012   Overview:  Last Assessment & Plan:  Constipation symptoms as well as associated abdominal pain has resolved with Colace and MiraLAX. Now having regular bowel movements, approximately 1-2 a day which are soft and do not require straining. Continue medications as previously directed and return for follow-up as needed for any new or worsening symptoms.  . Gestational diabetes    glyburide  . Headache(784.0)   . Prolactinoma ([place]) 02/11/2013   Followed by endocrinologist.  Prolactin drawn as per request of endocrinologist.  Levels <400 are nml (up to date) although testing in pregnancy is not conclusive.  Above 400--visual field testing.    Past Surgical History: Past Surgical History:  Procedure Laterality Date  . CESAREAN  SECTION     10 years ago and do NOT know why  . MASS EXCISION Left 09/14/2012   Procedure: EXCISION MASS;  Surgeon: Ruby Cola, MD;  Location: [facility] OR;  Service: ENT;  Laterality: Left;  removal of tongue mass    Obstetrical History: OB History    Gravida Para Term Preterm AB Living   3 2 2     2    SAB TAB Ectopic Multiple Live Births           2      Social History: Social History   Socioeconomic History  . Marital status: Married    Spouse name: [name]  . Number of children: 2  . Years of education: 6   . Highest education level: None  Social Needs  . Financial resource strain: None  . Food insecurity - worry: None  . Food insecurity - inability: None  . Transportation needs - medical: None  . Transportation needs - non-medical: None  Occupational History  . Occupation: Housewife  Tobacco Use  . Smoking status: Never Smoker  . Smokeless tobacco: Never Used  Substance and Sexual Activity  . Alcohol use: No  . Drug use: No  . Sexual activity: No  Other Topics Concern  . None  Social History Narrative  . None    Family History: Family History  Problem Relation Age of Onset  . Diabetes Mother   . Diabetes Father   . Diabetes Maternal Grandmother   . Diabetes Paternal Grandmother   . Diabetes Maternal Uncle   . Diabetes Paternal Uncle   . Diabetes Maternal Grandfather   .  Diabetes Paternal Grandfather     Allergies: No Known Allergies  Medications Prior to Admission  Medication Sig Dispense Refill Last Dose  . glyBURIDE (DIABETA) 1.25 MG tablet One pill by mouth with breakfast and one pill by mouth with dinner. 30 tablet 0 03/08/2017 at Unknown time  . glyBURIDE (DIABETA) 1.25 MG tablet TAKE 1 TABLET  1 25 MG TOTAL  BY MOUTH AT BEDTIME   03/08/2017 at Unknown time  . Prenatal Multivit-Min-Fe-FA (PRENATAL VITAMINS) 0.8 MG tablet Take 1 tablet by mouth daily. 30 tablet 12 03/08/2017 at Unknown time    Review of Systems   All systems reviewed and negative  except as stated in HPI  Blood pressure 112/67, pulse 85, temperature 98.3 F (36.8 C), temperature source Oral, resp. rate 18, height 4\' 7"  (1.397 m), weight 62.1 kg (137 lb), last menstrual period 06/15/2016, SpO2 100 %. General appearance: alert Lungs: no respiratory distress Heart: regular rate  Abdomen: soft, non-tender; bowel sounds normal Extremities:no sign of DVT Presentation: cephalic Fetal monitoringBaseline: 140 bpm, moderate variability +acc -dec Uterine activityFrequency: Every 3 minutes Dilation: 6.5 Effacement (%): 80 Exam by:: Chinita Greenland RN  Prenatal labs: ABO, Rh: O/Positive/-- (08/14 0948) Antibody: Negative (08/14 0948) Rubella: 9.46 (08/14 0948) RPR: Non Reactive (12/26 0801)  HBsAg: Negative (08/14 0948)  HIV: Non Reactive (12/26 0801)  GBS: Negative (02/21 1438)  1 hr Glucola 170 Genetic screening  1st trimester Anatomy US normal at 18 wks  Prenatal Transfer Tool  Maternal Diabetes: Yes:  Diabetes Type:  Insulin/Medication controlled Genetic Screening: Normal Maternal Ultrasounds/Referrals: Normal Fetal Ultrasounds or other Referrals:  None Maternal Substance Abuse:  No Significant Maternal Medications:  Meds include: Other: Glyburide Significant Maternal Lab Results: Lab values include: Group B Strep negative  Results for orders placed or performed during the hospital encounter of 03/09/17 (from the past 24 hour(s))  CBC   Collection Time: 03/09/17  6:05 PM  Result Value Ref Range   WBC 10.2 4.0 - 10.5 K/uL   RBC 4.04 3.87 - 5.11 MIL/uL   Hemoglobin 13.0 12.0 - 15.0 g/dL   HCT 35.9 (L) 36.0 - 46.0 %   MCV 88.9 78.0 - 100.0 fL   MCH 32.2 26.0 - 34.0 pg   MCHC 36.2 (H) 30.0 - 36.0 g/dL   RDW 13.5 11.5 - 15.5 %   Platelets 153 150 - 400 K/uL    Patient Active Problem List   Diagnosis Date Noted  . Pregnancy 03/09/2017  . Malpresentation before onset of labor 02/17/2017  . Language barrier 02/16/2017  . Gestational diabetes mellitus (GDM)  controlled on oral hypoglycemic drug 01/13/2017  . Previous cesarean delivery followed by [facility] 12/05/2016  . Supervision of high risk pregnancy, antepartum, third trimester 08/16/2016  . Prolactinoma ([place]) 02/11/2013    Assessment/Plan:  Amber Harper is a 32 y.o. G3P2002 at [redacted]w[redacted]d here for SOL. Hx of prolactinoma and gestational diabetes.   #Labor: Expectant management. Consider AROM after epidural placement #Pain: 8/10 Epidural as planned #FWB: Cat 1 #ID:  GBS negative #MOF: Breast #MOC:condoms  Gwendalyn Ege, Medical Student  03/09/2017, 6:46 PM   I personally saw and evaluated the patient, performing the key elements of the service. I developed and verified the management plan that is described in the medical student's note, and I agree with the content with my edits above.   Rory Percy, DO PGY-1, [facility] 03/09/2017 7:01 PM   Midwife attestation: I have seen and examined this patient; I agree  with above documentation in the resident's note.   Amber Harper is a 32 y.o. [id number] here for active labor at term. Her health hx is significant for prolactinoma tumor x 3-4 years, managed at [facility].  She is not on any medications during pregnancy and will resume medication management of tumor after breastfeeding.  PE: BP (!) 105/57   Pulse 90   Temp 98.3 F (36.8 C) (Oral)   Resp 18   Ht 4\' 7"  (1.397 m)   Wt 137 lb (62.1 kg)   LMP 06/15/2016 (Approximate)   SpO2 100%   BMI 31.84 kg/m  Gen: calm comfortable, NAD Resp: normal effort, no distress Abd: gravid  ROS, labs, PMH reviewed  Plan: Admit to [facility] Labor: Expectant management FWB: Category I ID: GBS negative  Fatima Blank, CNM  03/09/2017, 7:34 PM

## 2017-03-09 NOTE — Progress Notes (Addendum)
G3P2 @ 38.[redacted] wksga. Presents to triage again for ctx that's worse from this morning. Denies LOF/ little bleeding. +FM  EFM appled.   SVE: 6-7/80/BB  1756: Provider notified. Ordered to admit.   1756: Birthing charge nurse notified. Room assigned to 173  Labs drawn and IV started.

## 2017-03-09 NOTE — MAU Note (Signed)
Pt presents to MAU c/o ctxs q49min. Pt reports some vaginal bleeding that started at 0000. Pt reports seeing some blood in the toilet. Pt reports +FM.

## 2017-03-09 NOTE — Anesthesia Preprocedure Evaluation (Addendum)
Anesthesia Evaluation  Patient identified by MRN, date of birth, ID band Patient awake    Reviewed: Allergy & Precautions, Patient's Chart, lab work & pertinent test results  Airway Mallampati: I       Dental   Pulmonary    breath sounds clear to auscultation       Cardiovascular negative cardio ROS   Rhythm:Regular Rate:Normal     Neuro/Psych  Headaches, negative psych ROS   GI/Hepatic negative GI ROS, Neg liver ROS,   Endo/Other  diabetes, Gestational, Oral Hypoglycemic Agents  Renal/GU      Musculoskeletal  (+) Arthritis ,   Abdominal   Peds  Hematology   Anesthesia Other Findings   Reproductive/Obstetrics (+) Pregnancy                            Lab Results  Component Value Date   WBC 10.2 03/09/2017   HGB 13.0 03/09/2017   HCT 35.9 (L) 03/09/2017   MCV 88.9 03/09/2017   PLT 153 03/09/2017    Anesthesia Physical Anesthesia Plan  ASA: II  Anesthesia Plan: Epidural   Post-op Pain Management:    Induction:   PONV Risk Score and Plan:   Airway Management Planned:   Additional Equipment:   Intra-op Plan:   Post-operative Plan:   Informed Consent: I have reviewed the patients History and Physical, chart, labs and discussed the procedure including the risks, benefits and alternatives for the proposed anesthesia with the patient or authorized representative who has indicated his/her understanding and acceptance.     Plan Discussed with:   Anesthesia Plan Comments: (Pt denies symptoms consistent with increase ICP)       Anesthesia Quick Evaluation

## 2017-03-09 NOTE — Discharge Instructions (Signed)
Vaginal Delivery Vaginal delivery means that you will give birth by pushing your baby out of your birth canal (vagina). A team of health care providers will help you before, during, and after vaginal delivery. Birth experiences are unique for every woman and every pregnancy, and birth experiences vary depending on where you choose to give birth. What should I do to prepare for my baby's birth? Before your baby is born, it is important to talk with your health care provider about:  Your labor and delivery preferences. These may include: ? Medicines that you may be given. ? How you will manage your pain. This might include non-medical pain relief techniques or injectable pain relief such as epidural analgesia. ? How you and your baby will be monitored during labor and delivery. ? Who may be in the labor and delivery room with you. ? Your feelings about surgical delivery of your baby (cesarean delivery, or C-section) if this becomes necessary. ? Your feelings about receiving donated blood through an IV tube (blood transfusion) if this becomes necessary.  Whether you are able: ? To take pictures or videos of the birth. ? To eat during labor and delivery. ? To move around, walk, or change positions during labor and delivery.  What to expect after your baby is born, such as: ? Whether delayed umbilical cord clamping and cutting is offered. ? Who will care for your baby right after birth. ? Medicines or tests that may be recommended for your baby. ? Whether breastfeeding is supported in your hospital or birth center. ? How long you will be in the hospital or birth center.  How any medical conditions you have may affect your baby or your labor and delivery experience.  To prepare for your baby's birth, you should also:  Attend all of your health care visits before delivery (prenatal visits) as recommended by your health care provider. This is important.  Prepare your home for your baby's  arrival. Make sure that you have: ? Diapers. ? Baby clothing. ? Feeding equipment. ? Safe sleeping arrangements for you and your baby.  Install a car seat in your vehicle. Have your car seat checked by a certified car seat installer to make sure that it is installed safely.  Think about who will help you with your new baby at home for at least the first several weeks after delivery.  What can I expect when I arrive at the birth center or hospital? Once you are in labor and have been admitted into the hospital or birth center, your health care provider may:  Review your pregnancy history and any concerns you have.  Insert an IV tube into one of your veins. This is used to give you fluids and medicines.  Check your blood pressure, pulse, temperature, and heart rate (vital signs).  Check whether your bag of water (amniotic sac) has broken (ruptured).  Talk with you about your birth plan and discuss pain control options.  Monitoring Your health care provider may monitor your contractions (uterine monitoring) and your baby's heart rate (fetal monitoring). You may need to be monitored:  Often, but not continuously (intermittently).  All the time or for long periods at a time (continuously). Continuous monitoring may be needed if: ? You are taking certain medicines, such as medicine to relieve pain or make your contractions stronger. ? You have pregnancy or labor complications.  Monitoring may be done by:  Placing a special stethoscope or a handheld monitoring device on your abdomen to   check your baby's heartbeat, and feeling your abdomen for contractions. This method of monitoring does not continuously record your baby's heartbeat or your contractions.  Placing monitors on your abdomen (external monitors) to record your baby's heartbeat and the frequency and length of contractions. You may not have to wear external monitors all the time.  Placing monitors inside of your uterus  (internal monitors) to record your baby's heartbeat and the frequency, length, and strength of your contractions. ? Your health care provider may use internal monitors if he or she needs more information about the strength of your contractions or your baby's heart rate. ? Internal monitors are put in place by passing a thin, flexible wire through your vagina and into your uterus. Depending on the type of monitor, it may remain in your uterus or on your baby's head until birth. ? Your health care provider will discuss the benefits and risks of internal monitoring with you and will ask for your permission before inserting the monitors.  Telemetry. This is a type of continuous monitoring that can be done with external or internal monitors. Instead of having to stay in bed, you are able to move around during telemetry. Ask your health care provider if telemetry is an option for you.  Physical exam Your health care provider may perform a physical exam. This may include:  Checking whether your baby is positioned: ? With the head toward your vagina (head-down). This is most common. ? With the head toward the top of your uterus (head-up or breech). If your baby is in a breech position, your health care provider may try to turn your baby to a head-down position so you can deliver vaginally. If it does not seem that your baby can be born vaginally, your provider may recommend surgery to deliver your baby. In rare cases, you may be able to deliver vaginally if your baby is head-up (breech delivery). ? Lying sideways (transverse). Babies that are lying sideways cannot be delivered vaginally.  Checking your cervix to determine: ? Whether it is thinning out (effacing). ? Whether it is opening up (dilating). ? How low your baby has moved into your birth canal.  What are the three stages of labor and delivery?  Normal labor and delivery is divided into the following three stages: Stage 1  Stage 1 is the  longest stage of labor, and it can last for hours or days. Stage 1 includes: ? Early labor. This is when contractions may be irregular, or regular and mild. Generally, early labor contractions are more than 10 minutes apart. ? Active labor. This is when contractions get longer, more regular, more frequent, and more intense. ? The transition phase. This is when contractions happen very close together, are very intense, and may last longer than during any other part of labor.  Contractions generally feel mild, infrequent, and irregular at first. They get stronger, more frequent (about every 2-3 minutes), and more regular as you progress from early labor through active labor and transition.  Many women progress through stage 1 naturally, but you may need help to continue making progress. If this happens, your health care provider may talk with you about: ? Rupturing your amniotic sac if it has not ruptured yet. ? Giving you medicine to help make your contractions stronger and more frequent.  Stage 1 ends when your cervix is completely dilated to 4 inches (10 cm) and completely effaced. This happens at the end of the transition phase. Stage 2  Once   your cervix is completely effaced and dilated to 4 inches (10 cm), you may start to feel an urge to push. It is common for the body to naturally take a rest before feeling the urge to push, especially if you received an epidural or certain other pain medicines. This rest period may last for up to 1-2 hours, depending on your unique labor experience.  During stage 2, contractions are generally less painful, because pushing helps relieve contraction pain. Instead of contraction pain, you may feel stretching and burning pain, especially when the widest part of your baby's head passes through the vaginal opening (crowning).  Your health care provider will closely monitor your pushing progress and your baby's progress through the vagina during stage 2.  Your  health care provider may massage the area of skin between your vaginal opening and anus (perineum) or apply warm compresses to your perineum. This helps it stretch as the baby's head starts to crown, which can help prevent perineal tearing. ? In some cases, an incision may be made in your perineum (episiotomy) to allow the baby to pass through the vaginal opening. An episiotomy helps to make the opening of the vagina larger to allow more room for the baby to fit through.  It is very important to breathe and focus so your health care provider can control the delivery of your baby's head. Your health care provider may have you decrease the intensity of your pushing, to help prevent perineal tearing.  After delivery of your baby's head, the shoulders and the rest of the body generally deliver very quickly and without difficulty.  Once your baby is delivered, the umbilical cord may be cut right away, or this may be delayed for 1-2 minutes, depending on your baby's health. This may vary among health care providers, hospitals, and birth centers.  If you and your baby are healthy enough, your baby may be placed on your chest or abdomen to help maintain the baby's temperature and to help you bond with each other. Some mothers and babies start breastfeeding at this time. Your health care team will dry your baby and help keep your baby warm during this time.  Your baby may need immediate care if he or she: ? Showed signs of distress during labor. ? Has a medical condition. ? Was born too early (prematurely). ? Had a bowel movement before birth (meconium). ? Shows signs of difficulty transitioning from being inside the uterus to being outside of the uterus. If you are planning to breastfeed, your health care team will help you begin a feeding. Stage 3  The third stage of labor starts immediately after the birth of your baby and ends after you deliver the placenta. The placenta is an organ that develops  during pregnancy to provide oxygen and nutrients to your baby in the womb.  Delivering the placenta may require some pushing, and you may have mild contractions. Breastfeeding can stimulate contractions to help you deliver the placenta.  After the placenta is delivered, your uterus should tighten (contract) and become firm. This helps to stop bleeding in your uterus. To help your uterus contract and to control bleeding, your health care provider may: ? Give you medicine by injection, through an IV tube, by mouth, or through your rectum (rectally). ? Massage your abdomen or perform a vaginal exam to remove any blood clots that are left in your uterus. ? Empty your bladder by placing a thin, flexible tube (catheter) into your bladder. ? Encourage   you to breastfeed your baby. After labor is over, you and your baby will be monitored closely to ensure that you are both healthy until you are ready to go home. Your health care team will teach you how to care for yourself and your baby. This information is not intended to replace advice given to you by your health care provider. Make sure you discuss any questions you have with your health care provider. Document Released: 09/29/2007 Document Revised: 07/10/2015 Document Reviewed: 01/04/2015 Elsevier Interactive Patient Education  2018 [employer]  

## 2017-03-09 NOTE — Progress Notes (Signed)
Pt admitted with Spanish Interperter, Vira, at bedside. Pt desires epidural; consent signed with Interpreter. All questions answered.

## 2017-03-09 NOTE — MAU Note (Signed)
Pt present with c/o worsening ctxs since 1300 today, reports ctxs were every 5 minutes but are now every 3 minutes apart.  Denies LOF, but has bloody vaginal mucous.  Reports +FM. Pt sent home this morning, states cervix was 1.5cm dilates prior to discharge.

## 2017-03-10 ENCOUNTER — Encounter: Payer: Self-pay | Admitting: Family Medicine

## 2017-03-10 ENCOUNTER — Encounter (HOSPITAL_COMMUNITY): Payer: Self-pay

## 2017-03-10 DIAGNOSIS — Z3A38 38 weeks gestation of pregnancy: Secondary | ICD-10-CM

## 2017-03-10 LAB — CBC
HCT: 25.2 % — ABNORMAL LOW (ref 36.0–46.0)
HEMATOCRIT: 32.1 % — AB (ref 36.0–46.0)
HEMOGLOBIN: 9.1 g/dL — AB (ref 12.0–15.0)
Hemoglobin: 11.6 g/dL — ABNORMAL LOW (ref 12.0–15.0)
MCH: 32.5 pg (ref 26.0–34.0)
MCH: 32.7 pg (ref 26.0–34.0)
MCHC: 36.1 g/dL — ABNORMAL HIGH (ref 30.0–36.0)
MCHC: 36.1 g/dL — AB (ref 30.0–36.0)
MCV: 89.9 fL (ref 78.0–100.0)
MCV: 90.6 fL (ref 78.0–100.0)
Platelets: 129 10*3/uL — ABNORMAL LOW (ref 150–400)
Platelets: 146 10*3/uL — ABNORMAL LOW (ref 150–400)
RBC: 3.57 MIL/uL — AB (ref 3.87–5.11)
RBC: 2.78 MIL/uL — ABNORMAL LOW (ref 3.87–5.11)
RDW: 13.8 % (ref 11.5–15.5)
RDW: 13.9 % (ref 11.5–15.5)
WBC: 17.9 10*3/uL — AB (ref 4.0–10.5)
WBC: 24.1 10*3/uL — ABNORMAL HIGH (ref 4.0–10.5)

## 2017-03-10 LAB — GLUCOSE, CAPILLARY
GLUCOSE-CAPILLARY: 105 mg/dL — AB (ref 65–99)
Glucose-Capillary: 78 mg/dL (ref 65–99)

## 2017-03-10 LAB — POSTPARTUM HEMORRHAGE PROTOCOL (BB NOTIFICATION)

## 2017-03-10 LAB — DIC (DISSEMINATED INTRAVASCULAR COAGULATION)PANEL
D-Dimer, Quant: 4.46 ug/mL-FEU — ABNORMAL HIGH (ref 0.00–0.50)
Fibrinogen: 285 mg/dL (ref 210–475)
INR: 1.2
Platelets: 145 10*3/uL — ABNORMAL LOW (ref 150–400)
Prothrombin Time: 15.1 seconds (ref 11.4–15.2)
Smear Review: NONE SEEN
aPTT: 32 seconds (ref 24–36)

## 2017-03-10 LAB — RPR: RPR Ser Ql: NONREACTIVE

## 2017-03-10 MED ORDER — OXYTOCIN 40 UNITS IN LACTATED RINGERS INFUSION - SIMPLE MED
10.0000 [IU]/h | INTRAVENOUS | Status: DC
Start: 2017-03-10 — End: 2017-03-11

## 2017-03-10 MED ORDER — WITCH HAZEL-GLYCERIN EX PADS: 1.0000 "application " | MEDICATED_PAD | CUTANEOUS | Status: DC | PRN

## 2017-03-10 MED ORDER — TERBUTALINE SULFATE 1 MG/ML IJ SOLN
0.2500 mg | Freq: Once | INTRAMUSCULAR | Status: DC | PRN
  Filled 2017-03-10: qty 1

## 2017-03-10 MED ORDER — DIBUCAINE 1 % RE OINT: 1.0000 "application " | TOPICAL_OINTMENT | RECTAL | Status: DC | PRN

## 2017-03-10 MED ORDER — SODIUM CHLORIDE 0.9 % IV SOLN
2.0000 g | Freq: Once | INTRAVENOUS | Status: AC
  Administered 2017-03-10: 2 g via INTRAVENOUS
  Filled 2017-03-10: qty 2

## 2017-03-10 MED ORDER — ZOLPIDEM TARTRATE 5 MG PO TABS: 5.0000 mg | ORAL_TABLET | Freq: Every evening | ORAL | Status: DC | PRN

## 2017-03-10 MED ORDER — ONDANSETRON HCL 4 MG/2ML IJ SOLN: 4.0000 mg | INTRAMUSCULAR | Status: DC | PRN

## 2017-03-10 MED ORDER — SODIUM CHLORIDE 0.9 % IV SOLN
INTRAVENOUS | Status: DC
  Administered 2017-03-10: 11:00:00 via INTRAVENOUS

## 2017-03-10 MED ORDER — GENTAMICIN SULFATE 40 MG/ML IJ SOLN
1.5000 mg/kg | Freq: Once | INTRAMUSCULAR | Status: AC
  Administered 2017-03-10: 90 mg via INTRAVENOUS
  Filled 2017-03-10: qty 2.25

## 2017-03-10 MED ORDER — ACETAMINOPHEN 325 MG PO TABS
650.0000 mg | ORAL_TABLET | ORAL | Status: DC | PRN
Start: 2017-03-10 — End: 2017-03-11
  Administered 2017-03-10 (×2): 650 mg via ORAL
  Filled 2017-03-10 (×2): qty 2

## 2017-03-10 MED ORDER — METHYLERGONOVINE MALEATE 0.2 MG/ML IJ SOLN
INTRAMUSCULAR | Status: AC
  Administered 2017-03-10: 0.2 mg
  Filled 2017-03-10: qty 1

## 2017-03-10 MED ORDER — DIPHENHYDRAMINE HCL 25 MG PO CAPS: 25.0000 mg | ORAL_CAPSULE | Freq: Four times a day (QID) | ORAL | Status: DC | PRN

## 2017-03-10 MED ORDER — TETANUS-DIPHTH-ACELL PERTUSSIS 5-2.5-18.5 LF-MCG/0.5 IM SUSP: 0.5000 mL | Freq: Once | INTRAMUSCULAR | Status: DC

## 2017-03-10 MED ORDER — OXYTOCIN 40 UNITS IN LACTATED RINGERS INFUSION - SIMPLE MED
INTRAVENOUS | Status: AC
  Administered 2017-03-10: 10:00:00
  Filled 2017-03-10: qty 1000

## 2017-03-10 MED ORDER — ONDANSETRON HCL 4 MG PO TABS
4.0000 mg | ORAL_TABLET | ORAL | Status: DC | PRN
Start: 2017-03-10 — End: 2017-03-11
  Administered 2017-03-10: 4 mg via ORAL
  Filled 2017-03-10: qty 1

## 2017-03-10 MED ORDER — COCONUT OIL OIL: 1.0000 "application " | TOPICAL_OIL | Status: DC | PRN

## 2017-03-10 MED ORDER — SIMETHICONE 80 MG PO CHEW: 80.0000 mg | CHEWABLE_TABLET | ORAL | Status: DC | PRN

## 2017-03-10 MED ORDER — OXYTOCIN 40 UNITS IN LACTATED RINGERS INFUSION - SIMPLE MED
1.0000 m[IU]/min | INTRAVENOUS | Status: DC
  Administered 2017-03-10: 2 m[IU]/min via INTRAVENOUS

## 2017-03-10 MED ORDER — METHYLERGONOVINE MALEATE 0.2 MG/ML IJ SOLN: 0.2000 mg | Freq: Once | INTRAMUSCULAR | Status: DC

## 2017-03-10 MED ORDER — PRENATAL MULTIVITAMIN CH
1.0000 | ORAL_TABLET | Freq: Every day | ORAL | Status: DC
  Administered 2017-03-11: 1 via ORAL
  Filled 2017-03-10: qty 1

## 2017-03-10 MED ORDER — METHYLERGONOVINE MALEATE 0.2 MG/ML IJ SOLN
INTRAMUSCULAR | Status: AC
  Filled 2017-03-10: qty 1

## 2017-03-10 MED ORDER — BENZOCAINE-MENTHOL 20-0.5 % EX AERO: 1.0000 "application " | INHALATION_SPRAY | CUTANEOUS | Status: DC | PRN

## 2017-03-10 MED ORDER — SENNOSIDES-DOCUSATE SODIUM 8.6-50 MG PO TABS
2.0000 | ORAL_TABLET | ORAL | Status: DC
  Administered 2017-03-11: 2 via ORAL
  Filled 2017-03-10: qty 2

## 2017-03-10 MED ORDER — IBUPROFEN 600 MG PO TABS
600.0000 mg | ORAL_TABLET | Freq: Four times a day (QID) | ORAL | Status: DC
  Administered 2017-03-10 – 2017-03-11 (×6): 600 mg via ORAL
  Filled 2017-03-10 (×6): qty 1

## 2017-03-10 NOTE — Progress Notes (Signed)
Pt called out to use restroom. Nurse at bedside, once patient stood up knees gave out and nurse was able to get patient to lie down in the bed and called for more assistance. Patient was alert throughout the event.  I&O was done, 130ml removed. Vitals taken and MD and interpreter called to come to bedside. Fundus was 3U clots continued to come out with fundus check.  Code hemorrhage called when MD arrived.

## 2017-03-10 NOTE — Progress Notes (Signed)
POSTPARTUM HEMORRHAGE NOTE  Amber Harper is a 32yo [id number] [redacted]w[redacted]d s/p SVD 3/8. Labor course significant for fever intrapartum, treated with amp/gent. Placenta intact with EBL 232ml.  Called to the room to assess for PPH ~5 hrs after delivery. Patient began to faint when standing to move to the bathroom. Vitals at that time 74/44 with pulse 101, temp 97.8, and subjectively felt dizzy. Did not fall on the floor, no injuries sustained. Lochia heavy with many clots expressed. Code Hemorrhage called. IM methergine, 1L LR bolus, Pitocin bolus given. Stat CBC and coags ordered. Follow up vitals normalized (105/68, pulse 86). Uterine fundus firm on exam. No additional clots expressed on fundal massage. Total EBL ~1313ml.  F/u CBC 9.1, patient improved, will defer transfusion at this time. Coags pending.  CBC Latest Ref Rng & Units 03/10/2017 03/10/2017 03/10/2017  WBC 4.0 - 10.5 K/uL 24.1(H) - 17.9(H)  Hemoglobin 12.0 - 15.0 g/dL 9.1(L) - 11.6(L)  Hematocrit 36.0 - 46.0 % 25.2(L) - 32.1(L)  Platelets 150 - 400 K/uL 146(L) 145(L) 129(L)     LOS: 1 day    Rory Percy, DO 03/10/2017, 10:48 AM

## 2017-03-10 NOTE — Progress Notes (Signed)
Labor Progress Note  Amber Harper is a 32 y.o. G3P2002 at [redacted]w[redacted]d  admitted for active labor  S: Resting  O:  BP (!) 93/42   Pulse 81   Temp 99.1 F (37.3 C) (Oral)   Resp 18   Ht 4\' 7"  (1.397 m)   Wt 62.1 kg (137 lb)   LMP 06/15/2016 (Approximate)   SpO2 100%   BMI 31.84 kg/m   No intake/output data recorded.  FHT:  FHR: 140 bpm, variability: moderate,  accelerations:  Abscent,  decelerations:  Present early, variable UC:   regular, every 2-4 minutes SVE:   Dilation: 8.5 Effacement (%): 90 Station: 0 Exam by:: Dreama Saa RN AROM: clear at  2038  Labs: Lab Results  Component Value Date   WBC 10.2 03/09/2017   HGB 13.0 03/09/2017   HCT 35.9 (L) 03/09/2017   MCV 88.9 03/09/2017   PLT 153 03/09/2017    Assessment / Plan: 32 y.o. G3P2002 [redacted]w[redacted]d in active labor Spontaneous labor, progressing normally  Labor: Progressing normally, s/p AROM. Start Pitocin for augmentation since ctx spacing out and no change. Fetal Wellbeing:  Category II Pain Control:  Epidural Anticipated MOD:  VBAC  Expectant management   Luiz Blare, DO OB Fellow Center for [facility], [facility]

## 2017-03-10 NOTE — Anesthesia Postprocedure Evaluation (Signed)
Anesthesia Post Note  Patient: Amber Harper  Procedure(s) Performed: AN AD HOC LABOR EPIDURAL     Patient location during evaluation: Mother Baby Anesthesia Type: Epidural Level of consciousness: awake and alert Pain management: pain level controlled Vital Signs Assessment: post-procedure vital signs reviewed and stable Respiratory status: spontaneous breathing, nonlabored ventilation and respiratory function stable Cardiovascular status: stable Postop Assessment: no headache, no backache and epidural receding Anesthetic complications: no    Last Vitals:  Vitals:   03/10/17 0705 03/10/17 0732  BP: (!) 108/57 (!) 102/56  Pulse: 100 86  Resp:  16  Temp:  (!) 38 C  SpO2:  98%    Last Pain:  Vitals:   03/10/17 0732  TempSrc: Oral  PainSc: 0-No pain   Pain Goal:                 [employer]

## 2017-03-11 ENCOUNTER — Encounter (HOSPITAL_COMMUNITY): Payer: Self-pay | Admitting: Advanced Practice Midwife

## 2017-03-11 LAB — CBC
HCT: 20.4 % — ABNORMAL LOW (ref 36.0–46.0)
HEMOGLOBIN: 7.5 g/dL — AB (ref 12.0–15.0)
MCH: 32.9 pg (ref 26.0–34.0)
MCHC: 36.8 g/dL — AB (ref 30.0–36.0)
MCV: 89.5 fL (ref 78.0–100.0)
Platelets: 131 10*3/uL — ABNORMAL LOW (ref 150–400)
RBC: 2.28 MIL/uL — ABNORMAL LOW (ref 3.87–5.11)
RDW: 14.2 % (ref 11.5–15.5)
WBC: 22 10*3/uL — ABNORMAL HIGH (ref 4.0–10.5)

## 2017-03-11 MED ORDER — FERROUS FUMARATE 324 (106 FE) MG PO TABS: 1.0000 | ORAL_TABLET | Freq: Two times a day (BID) | ORAL | 3 refills | Status: DC

## 2017-03-11 MED ORDER — FERROUS FUMARATE 324 (106 FE) MG PO TABS
1.0000 | ORAL_TABLET | Freq: Two times a day (BID) | ORAL | Status: DC
  Administered 2017-03-11: 106 mg via ORAL
  Filled 2017-03-11 (×3): qty 1

## 2017-03-11 MED ORDER — IBUPROFEN 600 MG PO TABS: 600.0000 mg | ORAL_TABLET | Freq: Four times a day (QID) | ORAL | 0 refills | Status: DC | PRN

## 2017-03-11 NOTE — Lactation Note (Signed)
This note was copied from a baby's chart. Lactation Consultation Note [place] interpreter for [place] [phone] Dewaine Oats used for consult. Mom BF her 26 and 33 yr old for 2 months. Baby 60 hrs old. Wanting to cluster feed. Mom holding baby STS just after bath. Mom stated baby doesn't open mouth wide enough sometimes and hurts. Mom holding baby in cradle position. Assisted in football hold so mom can control the baby's head and latch when opens wide. Encouraged mom to bring baby to her instead of leaning over to baby. Support w/pillows given.  Mom has "V" shaped breast that she can easily express colostrum. Mom's nipples everted at the bottom end of breast. Mom lifts breast for feeding. Taught breast massage. Newborn behavior, feeding habits, STS, I&O, supply and demand discussed. Mom encouraged to feed baby 8-12 times/24 hours and with feeding cues.  Encouraged mom to call for assistance if needed.  [place] brochure given w/resources, support groups and [place] services.  Patient Name: Amber Harper [id number] Date: 03/11/2017 Reason for consult: Initial assessment   Maternal Data Has patient been taught Hand Expression?: Yes Does the patient have breastfeeding experience prior to this delivery?: Yes  Feeding Feeding Type: Breast Fed Length of feed: 5 min(still on the breast)  LATCH Score Latch: Repeated attempts needed to sustain latch, nipple held in mouth throughout feeding, stimulation needed to elicit sucking reflex.  Audible Swallowing: A few with stimulation  Type of Nipple: Everted at rest and after stimulation  Comfort (Breast/Nipple): Soft / non-tender  Hold (Positioning): Assistance needed to correctly position infant at breast and maintain latch.  LATCH Score: 7  Interventions Interventions: Breast feeding basics reviewed;Support pillows;Assisted with latch;Position options;Skin to skin;Breast massage;Hand express;Breast compression  Lactation Tools  Discussed/Used WIC Program: Yes   Consult Status Consult Status: Follow-up Date: 03/11/17 Follow-up type: In-patient    Aliegha Paullin, Elta [place] 03/11/2017, 5:09 AM

## 2017-03-11 NOTE — Progress Notes (Signed)
Discharge teaching, meds, follow-up appointment completed with interpreter.

## 2017-03-11 NOTE — Discharge Instructions (Signed)
Parto vaginal, cuidados posteriores  Vaginal Delivery, Care After  Siga estas instrucciones durante las prximas semanas. Estas indicaciones le proporcionan informacin acerca de cmo deber cuidarse despus del parto vaginal. Su mdico tambin podr darle indicaciones ms especficas. El tratamiento ha sido planificado segn las prcticas mdicas actuales, pero en algunos casos pueden ocurrir problemas. Llame al mdico si tiene problemas o preguntas.  Qu puedo esperar despus del procedimiento?  Despus de un parto vaginal, es frecuente tener lo siguiente:   Hemorragia leve de la vagina.   Dolor en el abdomen, la vagina y la zona de la piel entre la abertura vaginal y el ano (perineo).   Calambres plvicos.   Fatiga.    Siga estas indicaciones en su casa:  Medicamentos   Tome los medicamentos de venta libre y los recetados solamente como se lo haya indicado el mdico.   Si le recetaron un antibitico, tmelo como se lo haya indicado el mdico. No interrumpa la administracin del antibitico hasta que lo haya terminado.  Conducir     No conduzca ni opere maquinaria pesada mientras toma analgsicos recetados.   No conduzca durante 24horas si le administraron un sedante.  Estilo de vida   No beba alcohol. Esto es de suma importancia si est amamantando o toma analgsicos.   No consuma productos que contengan tabaco, incluidos cigarrillos, tabaco de mascar o cigarrillos electrnicos. Si necesita ayuda para dejar de fumar, consulte al mdico.  Qu debe comer y beber   Beba al menos 8vasos de ochoonzas (240cc) de agua todos los das a menos que el mdico le indique lo contrario. Si elige amamantar al beb, quiz deba beber an ms cantidad de agua.   Coma alimentos ricos en fibras todos los das. Estos alimentos pueden ayudarla a prevenir o aliviar el estreimiento. Los alimentos ricos en fibras incluyen, entre otros:  ? Panes y cereales integrales.  ? Arroz integral.  ? Frijoles.  ? Frutas y verduras  frescas.  Actividad   Retome sus actividades normales como se lo haya indicado el mdico. Pregntele al mdico qu actividades son seguras para usted.   Descanse todo lo que pueda. Trate de descansar o tomar una siesta mientras el beb est durmiendo.   No levante objetos que pesen ms que su beb o 10libras (4,5kg) hasta que el mdico le diga que es seguro.   Hable con el mdico sobre cundo puede retomar la actividad sexual. Esto puede depender de lo siguiente:  ? Riesgo de sufrir una infeccin.  ? Velocidad de cicatrizacin.  ? Comodidad y deseo de retomar la actividad sexual.  Cuidados vaginales   Si le realizaron una episiotoma o tuvo un desgarro vaginal, contrlese la zona todos los das para detectar signos de infeccin. Est atenta a los siguientes signos:  ? Aumento del enrojecimiento, la hinchazn o el dolor.  ? Mayor presencia de lquido o sangre.  ? Calor.  ? Pus o mal olor.   No use tampones ni se haga duchas vaginales hasta que el mdico la autorice.   Controle la sangre que elimina por la vagina para detectar cogulos de sangre. Estos pueden tener el aspecto de grumos de color rojo oscuro, o secrecin marrn o negra.  Instrucciones generales   Mantenga el perineo limpio y seco, como se lo haya indicado el mdico.   Use ropa cmoda y suelta.   Cuando vaya al bao, siempre higiencese de adelante hacia atrs.   Pregntele al mdico si puede ducharse o tomar baos de inmersin.   Si se le realiz una episiotoma o tuvo un desgarro perineal durante el trabajo del parto o el parto, es posible que el mdico le indique que no tome baos de inmersin durante un determinado tiempo.   Use un sostn que sujete y ajuste bien sus pechos.   Si es posible, pdale a alguien que la ayude con las tareas del hogar y a cuidar del beb durante al menos algunos das despus de que le den el alta del hospital.   Concurra a todas las visitas de seguimiento para usted y el beb, como se lo haya indicado el  mdico. Esto es importante.  Comunquese con un mdico si:   Tiene los siguientes sntomas:  ? Secrecin vaginal que tiene mal olor.  ? Dificultad para orinar.  ? Dolor al orinar.  ? Aumento o disminucin repentinos de la frecuencia de las deposiciones.  ? Ms enrojecimiento, hinchazn o dolor alrededor de la episiotoma o del desgarro vaginal.  ? Ms secrecin de lquido o sangre de la episiotoma o del desgarro vaginal.  ? Pus o mal olor proveniente de la episiotoma o del desgarro vaginal.  ? Fiebre.  ? Erupcin cutnea.  ? Poco inters o falta de inters en actividades que solan gustarle.  ? Dudas sobre su cuidado y el del beb.   Siente la episiotoma o el desgarro vaginal caliente al tacto.   La episiotoma o el desgarro vaginal se abren o no parecen cicatrizar.   Siente dolor en las mamas, o estn duras o enrojecidas.   Siente tristeza o preocupacin de forma inusual.   Siente nuseas o vomita.   Elimina cogulos de sangre grandes por la vagina. Si expulsa un cogulo de sangre por la vagina, gurdelo para mostrrselo a su mdico. No tire la cadena sin que el mdico examine el cogulo de sangre antes.   Orina ms de lo habitual.   Se siente mareada o se desmaya.   No ha amamantado para nada y no ha tenido un perodo menstrual durante 12 semanas despus del parto.   Dej de amamantar al beb y no ha tenido su perodo menstrual durante 12 semanas despus de dejar de amamantar.  Solicite ayuda de inmediato si:   Tiene los siguientes sntomas:  ? Dolor que no desaparece o no mejora con medicamentos.  ? Dolor en el pecho.  ? Dificultad para respirar.  ? Visin borrosa o manchas en la vista.  ? Pensamientos de autolesionarse o lesionar al beb.   Comienza a sentir dolor en el abdomen o en una de las piernas.   Presenta un dolor de cabeza intenso.   Se desmaya.   Tiene una hemorragia de la vagina tan intensa que empapa dos toallitas sanitarias en una hora.  Esta informacin no tiene como fin  reemplazar el consejo del mdico. Asegrese de hacerle al mdico cualquier pregunta que tenga.  Document Released: 12/20/2004 Document Revised: 04/13/2016 Document Reviewed: 01/04/2015  Elsevier Interactive Patient Education  2018 Elsevier Inc.

## 2017-03-11 NOTE — Discharge Summary (Signed)
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 yo G3P3003 at [redacted]w[redacted]d was admitted in Active Labor on 03/09/2017. Patient had an uncomplicated labor course as follows:  Membrane Rupture Time/Date: 8:38 PM ,03/09/2017   Intrapartum Procedures: Episiotomy: None [1]                                         Lacerations:  None [1]  Patient had a delivery of a Viable infant. 03/10/2017  Information for the patient's newborn:  Navah, Grondin [[id number]]  Delivery Method: Vag-Spont   At 1 hr PP pt had a T of 101.9 and was given a dose of Amp and Gent. Then approx 5 hrs PP she began to feel faint with ambulation and was evaluated and tx for PPH with the usual measures (fluids, bimanual and clot evac, methergine)- EBL 1300cc. By PPD#1 her vitals were stable and she was ambulating without difficulty; requesting d/c home. Hgb 7.5 (13.0 on admit). She is ambulating, tolerating a regular diet, passing flatus, and urinating well.  Patient is discharged home in stable condition on 03/11/17.   Physical exam  Vitals:   03/10/17 1306 03/10/17 1705 03/10/17 2115 03/11/17 0646  BP: 100/66 (!) 97/57 (!) 97/59 (!) 94/46  Pulse: 80 77 69 62  Resp: 16 16 18 18   Temp: 98.7 F (37.1 C) 98.2 F (36.8 C) 98.1 F (36.7 C) 97.9 F (36.6 C)  TempSrc: Oral Oral Oral Oral  SpO2:   98% 99%  Weight:      Height:       General: alert and cooperative Lochia: appropriate Uterine Fundus: firm Incision: N/A DVT Evaluation: No evidence of DVT seen on physical exam. Labs: Lab Results  Component Value Date   WBC 22.0 (H) 03/11/2017   HGB 7.5 (L) 03/11/2017   HCT 20.4 (L) 03/11/2017   MCV 89.5 03/11/2017   PLT 131 (L) 03/11/2017   CMP Latest Ref Rng & Units 04/28/2015  Glucose 65 - 99 mg/dL 109(H)  BUN 7 - 25 mg/dL  14  Creatinine 0.50 - 1.10 mg/dL 0.84  Sodium 135 - 146 mmol/L 141  Potassium 3.5 - 5.3 mmol/L 4.3  Chloride 98 - 110 mmol/L 107  CO2 20 - 31 mmol/L 26  Calcium 8.6 - 10.2 mg/dL 8.8  Total Protein 6.0 - 8.3 g/dL -  Total Bilirubin 0.2 - 1.2 mg/dL -  Alkaline Phos 39 - 117 U/L -  AST 0 - 37 U/L -  ALT 0 - 35 U/L -    Discharge instruction: per After Visit Summary and "Baby and Me Booklet".  After visit meds:  Allergies as of 03/11/2017   No Known Allergies     Medication List    STOP taking these medications   glyBURIDE 1.25 MG tablet Commonly known as:  DIABETA     TAKE these medications   Ferrous Fumarate 324 (106 Fe) MG Tabs tablet Commonly known as:  HEMOCYTE - 106 mg FE Take 1 tablet (106 mg of iron total) by mouth 2 (two) times daily.   ibuprofen 600 MG tablet Commonly known as:  ADVIL,MOTRIN Take 1 tablet (600 mg total) by mouth every 6 (six) hours as needed.   Prenatal Vitamins 0.8 MG tablet Take 1 tablet by mouth daily.       Diet: routine diet  Activity: Advance as tolerated. Pelvic rest for 6 weeks.   Outpatient follow up:4 weeks w/ 2hr GTT Follow up Appt: Future  Appointments  Date Time Provider [place]  04/20/2017  8:20 AM [facility] LAB [facility]OC  04/20/2017 10:15 AM Luvenia Redden, PA-C [facility] WOC   Follow up Visit:No Follow-up on file.  Postpartum contraception: Condoms  Newborn Data: Live born female  Birth Weight: 6 lb 2.1 oz (2780 g) APGAR: 6, 8  Newborn Delivery   Birth date/time:  03/10/2017 05:29:00 Delivery type:  Vaginal, Spontaneous; VBAC     Baby Feeding: Breast Disposition:home with mother   03/11/2017 Serita Grammes, CNM 7:38 AM

## 2017-03-13 ENCOUNTER — Other Ambulatory Visit: Payer: Self-pay | Admitting: Obstetrics and Gynecology

## 2017-03-13 DIAGNOSIS — D500 Iron deficiency anemia secondary to blood loss (chronic): Secondary | ICD-10-CM

## 2017-03-13 LAB — TYPE AND SCREEN
ABO/RH(D): O POS
ANTIBODY SCREEN: NEGATIVE
UNIT DIVISION: 0
Unit division: 0

## 2017-03-13 LAB — BPAM RBC
Blood Product Expiration Date: 201904032359
Blood Product Expiration Date: 201904032359
UNIT TYPE AND RH: 5100
Unit Type and Rh: 5100

## 2017-03-13 MED ORDER — FERROUS SULFATE 325 (65 FE) MG PO TABS: 325.0000 mg | ORAL_TABLET | Freq: Two times a day (BID) | ORAL | 3 refills | Status: AC

## 2017-03-13 MED FILL — IBUPROFEN 600 MG TABLET: 600 | 7 days supply | Qty: 30 | Fill #0

## 2017-03-13 NOTE — Progress Notes (Signed)
Rx change per request of patient via Juliann Mule Spanish Interpreter.  Laury Deep, MSN, CNM 03/13/2017 3:41 PM

## 2017-03-15 ENCOUNTER — Inpatient Hospital Stay (HOSPITAL_COMMUNITY): Admission: RE | Admit: 2017-03-15 | Payer: Self-pay | Source: Ambulatory Visit | Admitting: Family Medicine

## 2017-03-28 ENCOUNTER — Encounter: Payer: Self-pay | Admitting: Nurse Practitioner

## 2017-03-28 ENCOUNTER — Ambulatory Visit: Payer: Self-pay | Attending: Nurse Practitioner | Admitting: Nurse Practitioner

## 2017-03-28 VITALS — BP 114/81 | HR 74 | Temp 99.2°F | Ht <= 58 in | Wt 120.0 lb

## 2017-03-28 DIAGNOSIS — R509 Fever, unspecified: Secondary | ICD-10-CM | POA: Insufficient documentation

## 2017-03-28 DIAGNOSIS — Z79899 Other long term (current) drug therapy: Secondary | ICD-10-CM | POA: Insufficient documentation

## 2017-03-28 DIAGNOSIS — R05 Cough: Secondary | ICD-10-CM | POA: Insufficient documentation

## 2017-03-28 DIAGNOSIS — Z8632 Personal history of gestational diabetes: Secondary | ICD-10-CM | POA: Insufficient documentation

## 2017-03-28 DIAGNOSIS — M1990 Unspecified osteoarthritis, unspecified site: Secondary | ICD-10-CM | POA: Insufficient documentation

## 2017-03-28 DIAGNOSIS — Z833 Family history of diabetes mellitus: Secondary | ICD-10-CM | POA: Insufficient documentation

## 2017-03-28 DIAGNOSIS — Z9889 Other specified postprocedural states: Secondary | ICD-10-CM | POA: Insufficient documentation

## 2017-03-28 DIAGNOSIS — D352 Benign neoplasm of pituitary gland: Secondary | ICD-10-CM | POA: Insufficient documentation

## 2017-03-28 DIAGNOSIS — J029 Acute pharyngitis, unspecified: Secondary | ICD-10-CM

## 2017-03-28 DIAGNOSIS — R070 Pain in throat: Secondary | ICD-10-CM | POA: Insufficient documentation

## 2017-03-28 DIAGNOSIS — Z791 Long term (current) use of non-steroidal anti-inflammatories (NSAID): Secondary | ICD-10-CM | POA: Insufficient documentation

## 2017-03-28 LAB — POCT RAPID STREP A (OFFICE): RAPID STREP A SCREEN: NEGATIVE

## 2017-03-28 NOTE — Patient Instructions (Signed)
Faringitis (Pharyngitis) La faringitis es el dolor de garganta (faringe). La garganta presenta enrojecimiento, hinchazn y dolor. CUIDADOS EN EL HOGAR  Beba suficiente lquido para mantener la orina clara o de color amarillo plido.  Solo tome los medicamentos que le haya indicado su mdico. ? Si no toma los medicamentos segn las indicaciones podra volver a enfermarse. Finalice la prescripcin completa, aunque comience a sentirse mejor. ? No tome aspirina.  Reposo.  Enjuguese la boca Immunologist) con agua y sal (cucharadita de sal por litro de agua) cada 1 o 2horas. Esto ayudar a Best boy.  Si no corre riesgo de ahogarse, puede chupar un caramelo duro o pastillas para la garganta.  SOLICITE AYUDA SI:  Tiene bultos grandes y dolorosos al tacto en el cuello.  Tiene una erupcin cutnea.  Cuando tose elimina una expectoracin verde, amarillo amarronado o con [place].  SOLICITE AYUDA DE INMEDIATO SI:  Presenta rigidez en el cuello.  Babea o no puede tragar lquidos.  Vomita o no puede retener los [employer] lquidos.  Siente un dolor intenso que no se alivia con medicamentos.  Tiene problemas para Ambulance person (y no debido a la nariz tapada).  ASEGRESE DE QUE:  Comprende estas instrucciones.  Controlar su afeccin.  Recibir ayuda de inmediato si no mejora o si empeora.  Esta informacin no tiene Marine scientist el consejo del mdico. Asegrese de hacerle al mdico cualquier pregunta que tenga. Document Released: 03/18/2008 Document Revised: 10/10/2012 Document Reviewed: 08/27/2012 Elsevier Interactive Patient Education  2017 Tilden de garganta (Sore Throat) El dolor de garganta se asocia con estos sntomas:  Dolor.  Ardor.  Irritacin.  Picazn. Muchas cosas pueden causar dolor de garganta, entre ellas:  Una infeccin.  Alergias.  La sequedad en el aire.  Humo o polucin.  Enfermedad por reflujo  gastroesofgico (ERGE).  Un tumor. El dolor de garganta puede ser el primer signo de otra enfermedad. Puede estar acompaado de otros problemas, como tos o Happy. La mayora de los dolores de garganta desaparecen sin tratamiento. [place] los medicamentos de venta libre solamente como se lo haya indicado el mdico.  Beba suficiente lquido para Theatre manager el pis (orina) claro o de color amarillo plido.  Descanse cuando tenga la necesidad de [place].  Para ayudar a Best boy, intente lo siguiente: ? Beba lquidos calientes, como caldos, infusiones o agua caliente. ? Tambin puede comer o beber lquidos fros o congelados, tales como paletas de hielo congelado. ? Haga grgaras con una mezcla de agua y sal 3 o 4veces al da, o cuando sea necesario. Para preparar la mezcla de agua con sal, agregue de media a 1cucharadita de sal en 1taza de agua templada. Mezcle hasta que no pueda ver ms la sal. ? Chupar caramelos duros o pastillas para la garganta. ? Ponga un humidificador de vapor fro en su habitacin durante la noche. ? Tambin puede abrir el agua caliente de la ducha y sentarse en el bao con la puerta cerrada durante 5a21minutos.  No consuma ningn producto que contenga tabaco, lo que incluye cigarrillos, tabaco de Higher education careers adviser y Psychologist, sport and exercise. Si necesita ayuda para dejar de fumar, consulte al mdico. SOLICITE AYUDA SI:  Tiene fiebre por ms de 2 a 3 das.  Sigue teniendo sntomas durante ms de 2 o 3das.  La garganta no le mejora en 7 das.  Tiene fiebre y los sntomas empeoran repentinamente. SOLICITE AYUDA DE INMEDIATO SI:  Tiene dificultad para respirar.  No puede tragar lquidos, alimentos blandos o la saliva.  Tiene hinchazn que empeora en la garganta o en el cuello.  Sigue sintiendo ganas de vomitar.  Sigue vomitando. Esta informacin no tiene Marine scientist el consejo del mdico. Asegrese de hacerle al mdico cualquier  pregunta que tenga. Document Released: 12/07/2011 Document Revised: 04/13/2015 Document Reviewed: 10/10/2014 Elsevier Interactive Patient Education  [employer].

## 2017-03-28 NOTE — Progress Notes (Signed)
Assessment & Plan:  Amber Harper was seen today for sore throat.  Diagnoses and all orders for this visit:  Acute sore throat POCT Strep A Warm salt water gargles to help relieve throat pain Tylenol for fever reduction  Prolactinoma [facility]) Seeing Endocrinology in [place]. Was instructed to follow up after she has stopped breastfeeding.   Patient has been counseled on age-appropriate routine health concerns for screening and prevention. These are reviewed and up-to-date. Referrals have been placed accordingly. Immunizations are up-to-date or declined.    Subjective:   Chief Complaint  Patient presents with  . Sore Throat    Pt stated she have not been eating well due to her throat hurts and started 3 days, Yellow mucus, Pt stated she took temperature of 101 last night.    HPI Amber Harper 32 y.o. female presents to office today as add on for sore throat. VRI was used to communicate directly with patient for the entire encounter including providing detailed patient instructions. She endorses heavy bleeding post birth which has now reo   Sore Throat Patient complains of sore throat. Associated symptoms include sore throat, productive cough, chills, fever (overnight).Onset of symptoms was 3 days ago, gradually worsening since that time. She is not drinking much. She has not had recent close exposure to someone with proven streptococcal pharyngitis.   Review of Systems  Constitutional: Positive for chills and fever. Negative for malaise/fatigue and weight loss.  HENT: Positive for congestion and sore throat. Negative for nosebleeds.   Eyes: Negative.  Negative for blurred vision, double vision and photophobia.  Respiratory: Positive for cough. Negative for shortness of breath.   Cardiovascular: Negative.  Negative for chest pain, palpitations and leg swelling.  Gastrointestinal: Negative.  Negative for heartburn, nausea and vomiting.  Musculoskeletal: Negative.  Negative  for myalgias.  Neurological: Negative.  Negative for dizziness, focal weakness, seizures and headaches.  Psychiatric/Behavioral: Negative.  Negative for suicidal ideas.    Past Medical History:  Diagnosis Date  . Arthritis   . Constipation 07/04/2012   Overview:  Last Assessment & Plan:  Constipation symptoms as well as associated abdominal pain has resolved with Colace and MiraLAX. Now having regular bowel movements, approximately 1-2 a day which are soft and do not require straining. Continue medications as previously directed and return for follow-up as needed for any new or worsening symptoms.  . Gestational diabetes    glyburide  . Headache(784.0)   . Prolactinoma ([facility]) 02/11/2013   Followed by endocrinologist.  Prolactin drawn as per request of endocrinologist.  Levels <400 are nml (up to date) although testing in pregnancy is not conclusive.  Above 400--visual field testing.    Past Surgical History:  Procedure Laterality Date  . CESAREAN SECTION     10 years ago and do NOT know why  . MASS EXCISION Left 09/14/2012   Procedure: EXCISION MASS;  Surgeon: Ruby Cola, MD;  Location: [facility];  Service: ENT;  Laterality: Left;  removal of tongue mass    Family History  Problem Relation Age of Onset  . Diabetes Mother   . Diabetes Father   . Diabetes Maternal Grandmother   . Diabetes Paternal Grandmother   . Diabetes Maternal Uncle   . Diabetes Paternal Uncle   . Diabetes Maternal Grandfather   . Diabetes Paternal Grandfather     Social History Reviewed with no changes to be made today.   Outpatient Medications Prior to Visit  Medication Sig Dispense Refill  . ibuprofen (ADVIL,MOTRIN) 600  MG tablet Take 1 tablet (600 mg total) by mouth every 6 (six) hours as needed. 30 tablet 0  . Prenatal Multivit-Min-Fe-FA (PRENATAL VITAMINS) 0.8 MG tablet Take 1 tablet by mouth daily. 30 tablet 12  . ferrous sulfate 325 (65 FE) MG tablet Take 1 tablet (325 mg total) by mouth 2 (two) times  daily with a meal. (Patient not taking: Reported on 03/28/2017) 60 tablet 3   No facility-administered medications prior to visit.     No Known Allergies     Objective:    BP 114/81 (BP Location: Left Arm, Patient Position: Sitting, Cuff Size: Normal)   Pulse 74   Temp 99.2 F (37.3 C) (Oral)   Ht 4\' 6"  (1.372 m)   Wt 120 lb (54.4 kg)   LMP 06/15/2016 (Approximate)   SpO2 99%   BMI 28.93 kg/m  Wt Readings from Last 3 Encounters:  03/28/17 120 lb (54.4 kg)  03/09/17 137 lb (62.1 kg)  03/09/17 137 lb (62.1 kg)    Physical Exam  Constitutional: She is oriented to person, place, and time. She appears well-developed and well-nourished. She is cooperative.  HENT:  Head: Normocephalic and atraumatic.  Right Ear: Hearing, tympanic membrane, external ear and ear canal normal.  Left Ear: Hearing, external ear and ear canal normal. A middle ear effusion is present.  Nose: Mucosal edema present.  Mouth/Throat: Uvula is midline and mucous membranes are normal. Posterior oropharyngeal edema present. No posterior oropharyngeal erythema.  Eyes: EOM are normal.  Neck: Normal range of motion.  Cardiovascular: Normal rate, regular rhythm and normal heart sounds. Exam reveals no gallop and no friction rub.  No murmur heard. Pulmonary/Chest: Effort normal and breath sounds normal. No tachypnea. No respiratory distress. She has no decreased breath sounds. She has no wheezes. She has no rhonchi. She has no rales. She exhibits no tenderness.  Abdominal: Soft. Bowel sounds are normal.  Musculoskeletal: Normal range of motion. She exhibits no edema.  Neurological: She is alert and oriented to person, place, and time. Coordination normal.  Skin: Skin is warm and dry.  Psychiatric: She has a normal mood and affect. Her behavior is normal. Judgment and thought content normal.  Nursing note and vitals reviewed.      Patient has been counseled extensively about nutrition and exercise as well as the  importance of adherence with medications and regular follow-up. The patient was given clear instructions to go to ER or return to medical center if symptoms don't improve, worsen or new problems develop. The patient verbalized understanding.   Follow-up: No follow-ups on file.   Gildardo Pounds, FNP-BC [facility] [place], [place]   03/28/2017, 2:27 PM

## 2017-03-31 LAB — CULTURE, GROUP A STREP: Strep A Culture: NEGATIVE

## 2017-04-20 ENCOUNTER — Other Ambulatory Visit: Payer: Self-pay

## 2017-04-20 ENCOUNTER — Encounter: Payer: Self-pay | Admitting: Medical

## 2017-04-20 ENCOUNTER — Ambulatory Visit (INDEPENDENT_AMBULATORY_CARE_PROVIDER_SITE_OTHER): Payer: Self-pay | Admitting: Medical

## 2017-04-20 DIAGNOSIS — Z862 Personal history of diseases of the blood and blood-forming organs and certain disorders involving the immune mechanism: Secondary | ICD-10-CM

## 2017-04-20 DIAGNOSIS — Z8632 Personal history of gestational diabetes: Secondary | ICD-10-CM | POA: Insufficient documentation

## 2017-04-20 DIAGNOSIS — Z8759 Personal history of other complications of pregnancy, childbirth and the puerperium: Secondary | ICD-10-CM | POA: Insufficient documentation

## 2017-04-20 DIAGNOSIS — D352 Benign neoplasm of pituitary gland: Secondary | ICD-10-CM

## 2017-04-20 DIAGNOSIS — O24415 Gestational diabetes mellitus in pregnancy, controlled by oral hypoglycemic drugs: Secondary | ICD-10-CM

## 2017-04-20 NOTE — Patient Instructions (Signed)
Eleccin del mtodo anticonceptivo (Contraception Choices) La anticoncepcin (control de la natalidad) es el uso de cualquier mtodo o dispositivo para Therapist, occupational. A continuacin se indican algunos de esos mtodos. ANTICONCEPTIVOS HORMONALES  Un pequeo tubo colocado bajo la piel de la parte superior del brazo (implante). El tubo puede Nutritional therapist durante 3 aos. El implante debe quitarse despus de 3 aos.  Inyecciones que se aplican cada 3 meses.  Pldoras que deben [employer].  Parches que se cambian una vez por semana.  Un anillo que se coloca en la vagina (anillo vaginal). El anillo se deja en su lugar durante 3 semanas y se retira durante 1 semana. Luego se coloca un nuevo anillo en la vagina.  Pldoras para el control de la natalidad despus de Best boy sexo (relaciones sexuales) sin proteccin.  ANTICONCEPTIVOS DE Ophelia Charter cubierta delgada que se [place] sobe el pene (condn masculino) que se coloca durante las relaciones sexuales.  Una cubierta blanda y suelta que se coloca en la vagina (condn femenino) antes de las relaciones sexuales.  Un dispositivo de goma que se aplica sobre el cuello del tero (diafragma). Este dispositivo debe ser hecho para usted. Se coloca en la vagina antes de Clinical biochemist. Debe dejarlo colocado en la vagina durante 6 a 8 horas despus de las [employer].  Un capuchn pequeo y [employer] se fija sobre el cuello del tero (capuchn cervical). Este capuchn debe ser hecho para usted. Debe dejarlo colocado en la vagina durante 48 horas despus de las [employer].  Una esponja que se coloca en la vagina antes de Clinical biochemist.  Una sustancia qumica que destruye o impide que los espermatozoides ingresen al cuello y al tero (espermicida). La sustancia qumica puede ser en crema, gel, espuma o pldoras.  DISPOSITIVO DE CONTROL INTRAUTERINO (DIU)  El DIU es un pequeo dispositivo  plstico en forma de T. Se coloca dentro del tero. Hay dos tipos de DIU: ? DIU de cobre. El dispositivo est recubierto en alambre de cobre. El cobre produce un lquido que [employer] espermatozoides. Puede permanecer colocado durante 10 aos. ? DIU hormonal. La hormona impide que ocurra el embarazo. Puede permanecer colocado durante 5 aos.  MTODOS PERMANENTES  La mujer puede hacerse sellar, ligar u obstruir las trompas de [place] durante [place]. Esto impide que el vulo llegue hasta el tero.  El mdico coloca un alambre diminuto o lo inserta en cada una de las trompas de [place]. Esto produce un tejido cicatrizal que obstruye las trompas de [place].  En el hombre pueden ligarse los conductos por los que pasan los espermatozoides (vasectoma).  CONTROL DE LA NATALIDAD POR PLANIFICACIN FAMILIAR NATURAL  La planificacin familiar natural significa no tener Armed forces operational officer o usar un mtodo anticonceptivo de Chiropractor en los perodos frtiles de la [place].  Use a un almanaque para llevar un registro de la extensin de cada perodo y para [employer] que puede quedar [place].  Evite tener relaciones sexuales durante la ovulacin.  Use un termmetro para medir la Firefighter. Tambin reconozca los sntomas de la ovulacin.  El momento de [employer] relaciones sexuales debe ser despus de que la mujer [place]. Use condones para protegerse de las enfermedades de transmisin sexual (ETS). Hgalo independientemente del tipo de [employer] use. Hable con su mdico acerca de cul es el mejor mtodo anticonceptivo para usted. Esta informacin no tiene Marine scientist el consejo del mdico.  Asegrese de hacerle al mdico cualquier pregunta que tenga. Document Released: 04/06/2010 Document Revised: 12/25/2012 Document Reviewed: 07/11/2012 Elsevier Interactive Patient Education  2017 [employer].

## 2017-04-20 NOTE — Progress Notes (Signed)
Subjective:     Samyra Limb is a 32 y.o. female who presents for a postpartum visit. She is 6 weeks postpartum following a spontaneous vaginal delivery. I have fully reviewed the prenatal and intrapartum course. The delivery was at 51 gestational weeks. Outcome: spontaneous vaginal delivery. Anesthesia: epidural. Postpartum course has been normal. Baby's course has been normal. Baby is feeding by both breast and bottle - Jerlyn Ly Start . Bleeding no bleeding. Bowel function is abnormal: occasional constipation . Bladder function is normal. Patient is not sexually active. Contraception method is abstinence. Postpartum depression screening: negative.  The following portions of the patient's history were reviewed and updated as appropriate: allergies, current medications, past family history, past medical history, past social history, past surgical history and problem list.  Review of Systems Pertinent items are noted in HPI.  Objective:    BP 106/67   Pulse 63   Ht 4' 6.33" (1.38 m)   Wt 117 lb 12.8 oz (53.4 kg)   LMP 06/15/2016 (Approximate)   BMI 28.06 kg/m   General:  alert and cooperative   Breasts:    Lungs: clear to auscultation bilaterally  Heart:  regular rate and rhythm, S1, S2 normal, no murmur, click, rub or gallop  Abdomen: soft, non-tender; bowel sounds normal; no masses,  no organomegaly   Vulva:  not evaluated  Vagina: not evaluated  Cervix:  not evaluated  Corpus: not examined  Adnexa:  not evaluated  Rectal Exam: Not performed.        Assessment:     Normal postpartum exam. Pap smear not done at today's visit. Last pap smear 2018 was normal H/O GDM with this pregnancy  Prolactinoma   Plan:    1. Contraception: condoms 2. Will contact with 2 hour GTT results when final  3. Follow-up with endocrinology advised when no longer breastfeeding  4.  Follow up in: 1 year or sooner as needed.    Luvenia Redden, PA-C 04/20/2017 8:51 AM

## 2017-04-21 LAB — GLUCOSE TOLERANCE, 2 HOURS
Glucose, 2 hour: 78 mg/dL (ref 65–139)
Glucose, GTT - Fasting: 94 mg/dL (ref 65–99)

## 2017-07-19 ENCOUNTER — Ambulatory Visit (HOSPITAL_COMMUNITY)
Admission: EM | Admit: 2017-07-19 | Discharge: 2017-07-19 | Disposition: A | Discharge status: Home / Self Care | Payer: Self-pay | Attending: Internal Medicine | Admitting: Internal Medicine

## 2017-07-19 ENCOUNTER — Encounter (HOSPITAL_COMMUNITY): Payer: Self-pay | Admitting: Emergency Medicine

## 2017-07-19 DIAGNOSIS — L237 Allergic contact dermatitis due to plants, except food: Secondary | ICD-10-CM

## 2017-07-19 MED ORDER — PREDNISONE 20 MG PO TABS: 40.0000 mg | ORAL_TABLET | Freq: Every day | ORAL | 0 refills | Status: AC

## 2017-07-19 MED FILL — predniSONE 20 MG TABS: 20 | 5 days supply | Qty: 10 | Fill #0

## 2017-07-19 NOTE — ED Triage Notes (Signed)
Pt c/o rash on arms, chest, legs.

## 2017-07-19 NOTE — ED Provider Notes (Signed)
[place]   [id number] 07/19/17 Arrival Time: 4259  SUBJECTIVE:  Amber Harper is a 32 y.o. female who presents with a rash that began 5 days ago.  Denies changes in soaps, detergents, but states husband with similar rash on right forearm.  Rash is diffuse about the left elbow, chest, and lower extremities.  Describes it as itchy.  Denies aggravating or alleviating factors.  Denies similar symptoms in the past.   Denies fever, chills, nausea, vomiting, swollen glands, SOB, chest pain, abdominal pain, changes in bowel or bladder function.    ROS: As per HPI.  Past Medical History:  Diagnosis Date  . Arthritis   . Constipation 07/04/2012   Overview:  Last Assessment & Plan:  Constipation symptoms as well as associated abdominal pain has resolved with Colace and MiraLAX. Now having regular bowel movements, approximately 1-2 a day which are soft and do not require straining. Continue medications as previously directed and return for follow-up as needed for any new or worsening symptoms.  . Gestational diabetes    glyburide  . Headache(784.0)   . 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  Prolactin drawn as per request of endocrinologist.  Levels <400 are nml (up to date) although testing in pregnancy is not conclusive.  Above 400--visual field testing.   Past Surgical History:  Procedure Laterality Date  . CESAREAN SECTION     10 years ago and do NOT know why  . MASS EXCISION Left 09/14/2012   Procedure: EXCISION MASS;  Surgeon: Ruby Cola, MD;  Location: [facility] OR;  Service: ENT;  Laterality: Left;  removal of tongue mass   No Known Allergies No current facility-administered medications on file prior to encounter.    Current Outpatient Medications on File Prior to Encounter  Medication Sig Dispense Refill  . docusate sodium (COLACE) 100 MG capsule Take 100 mg by mouth 2 (two) times daily.    . ferrous sulfate 325 (65 FE) MG tablet Take 1 tablet (325  mg total) by mouth 2 (two) times daily with a meal. 60 tablet 3  . ibuprofen (ADVIL,MOTRIN) 600 MG tablet Take 1 tablet (600 mg total) by mouth every 6 (six) hours as needed. (Patient not taking: Reported on 04/20/2017) 30 tablet 0  . Prenatal Multivit-Min-Fe-FA (PRENATAL VITAMINS) 0.8 MG tablet Take 1 tablet by mouth daily. 30 tablet 12   Social History   Socioeconomic History  . Marital status: Married    Spouse name: [name]  . Number of children: 2  . Years of education: 6   . Highest education level: Not on file  Occupational History  . Occupation: Housewife  Social Needs  . Financial resource strain: Not on file  . Food insecurity:    Worry: Not on file    Inability: Not on file  . Transportation needs:    Medical: Not on file    Non-medical: Not on file  Tobacco Use  . Smoking status: Never Smoker  . Smokeless tobacco: Never Used  Substance and Sexual Activity  . Alcohol use: No  . Drug use: No  . Sexual activity: Never  Lifestyle  . Physical activity:    Days per week: Not on file    Minutes per session: Not on file  . Stress: Not on file  Relationships  . Social connections:    Talks on phone: Not on file    Gets together: Not on file    Attends religious service: Not on file    Active  member of club or organization: Not on file    Attends meetings of clubs or organizations: Not on file    Relationship status: Not on file  . Intimate partner violence:    Fear of current or ex partner: Not on file    Emotionally abused: Not on file    Physically abused: Not on file    Forced sexual activity: Not on file  Other Topics Concern  . Not on file  Social History Narrative  . Not on file   Family History  Problem Relation Age of Onset  . Diabetes Mother   . Diabetes Father   . Diabetes Maternal Grandmother   . Diabetes Paternal Grandmother   . Diabetes Maternal Uncle   . Diabetes Paternal Uncle   . Diabetes Maternal Grandfather   . Diabetes Paternal  Grandfather     OBJECTIVE: Vitals:   07/19/17 1536  BP: 99/66  Pulse: 72  Resp: 16  Temp: 98.3 F (36.8 C)  SpO2: 100%    General appearance: alert; no distress Lungs: clear to auscultation bilaterally Heart: regular rate and rhythm.  Radial pulse 2+ bilaterally Extremities: no edema Skin: warm and dry; erythematous maculopapular rash in linear distribution about the posterior lateral aspect of the left elbow with involvement of the anterior chest, and bilateral anterior lower extremities; no obvious discharge; nontender to palpation; blanches with pressure Psychological: alert and cooperative; normal mood and affect        ASSESSMENT & PLAN:  1. Allergic contact dermatitis due to plants, except food     Meds ordered this encounter  Medications  . predniSONE (DELTASONE) 20 MG tablet    Sig: Take 2 tablets (40 mg total) by mouth daily for 5 days.    Dispense:  10 tablet    Refill:  0    Order Specific Question:   Supervising Provider    Answer:   Wynona Luna [[id number]]   Wash with warm water and mild soap Take oral steroid as prescribed and to completion.  Steroid can be present in breast milk, to limit potential side effects of oral steroid on infant, avoid breast feeding for 4 hours after taking medication.   Continue OTC medications as needed for symptomatic relief Follow up with PCP if symptoms persists  Return or go to the ED if you experience any new or worsening symptoms  Reviewed expectations re: course of current medical issues. Questions answered. Outlined signs and symptoms indicating need for more acute intervention. Patient verbalized understanding. After Visit Summary given.   Lestine Box, PA-C 07/19/17 1558

## 2017-07-19 NOTE — Discharge Instructions (Addendum)
Wash with warm water and mild soap Take oral steroid as prescribed and to completion.  Steroid can be present in breast milk, to limit potential side effects of oral steroid on infant, avoid breast feeding for 4 hours after taking medication.   Continue OTC medications as needed for symptomatic relief Follow up with PCP if symptoms persists  Return or go to the ED if you experience any new or worsening symptoms   Lavar con agua tibia y [place]. [place] el esteroide oral segn lo prescrito y Academic librarian. El esteroide puede estar presente en la [place], para limitar los posibles efectos secundarios del esteroide oral en el beb, evite la lactancia materna durante 4 horas despus de tomar el medicamento. Continuar los medicamentos de venta libre segn sea necesario para el alivio sintomtico Seguimiento con PCP si los sntomas persisten. Regrese o vaya a la sala de emergencias si experimenta sntomas nuevos o que empeoran.

## 2017-10-02 ENCOUNTER — Ambulatory Visit: Payer: Self-pay | Attending: Nurse Practitioner

## 2017-10-24 ENCOUNTER — Ambulatory Visit: Payer: Self-pay | Admitting: Nurse Practitioner

## 2019-05-23 IMAGING — US US MFM OB DETAIL+14 WK
1 series · 14 of 28 positions shown · non-contrast
Comparison: none

[Series 1: us mfm ob detail+14 wk · 133 acquisitions, 14 frames shown]
[im 5/133]
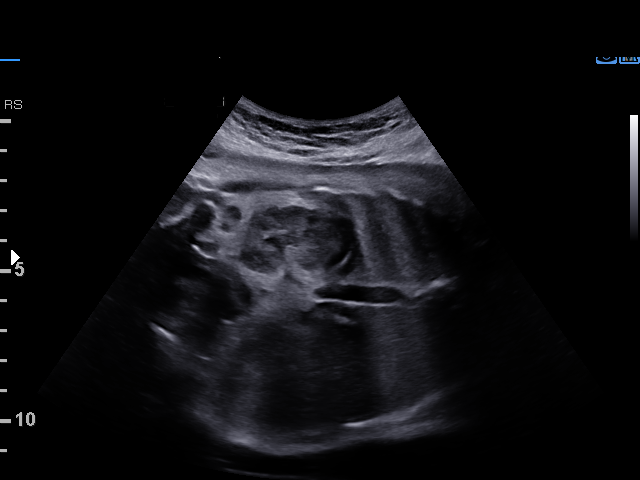
[im 15/133]
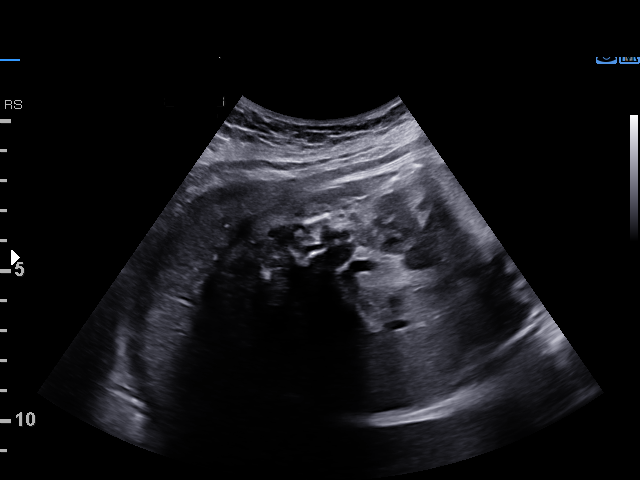
[im 25/133]
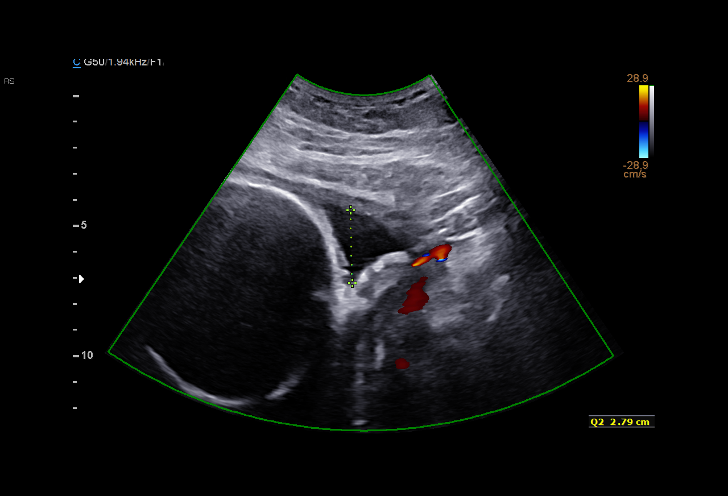
[im 35/133]
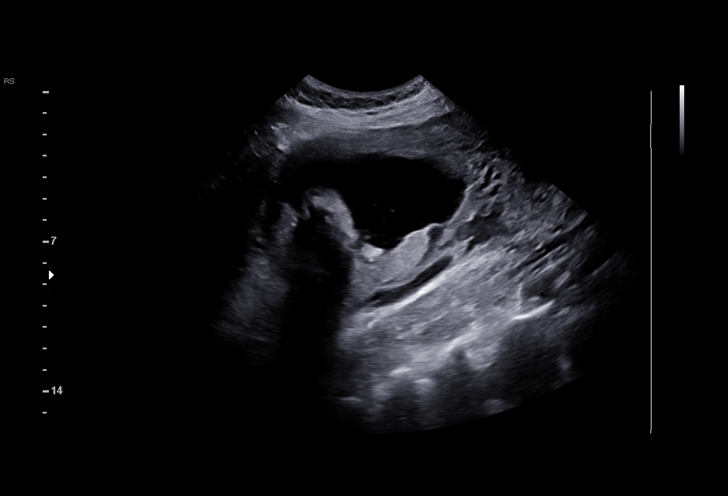
[im 45/133]
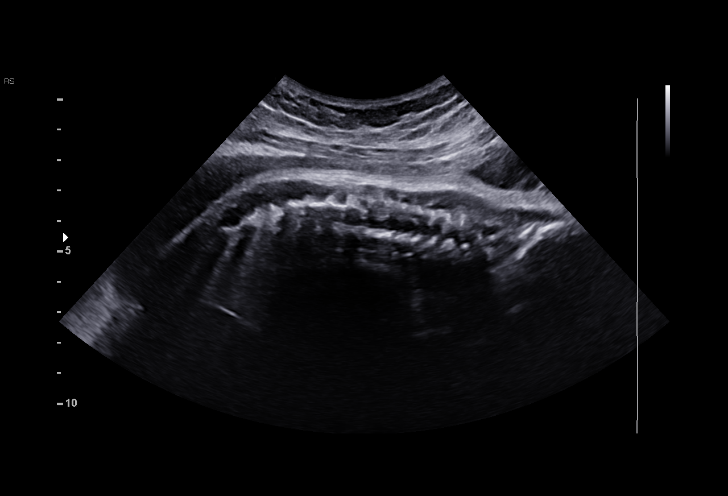
[im 54/133]
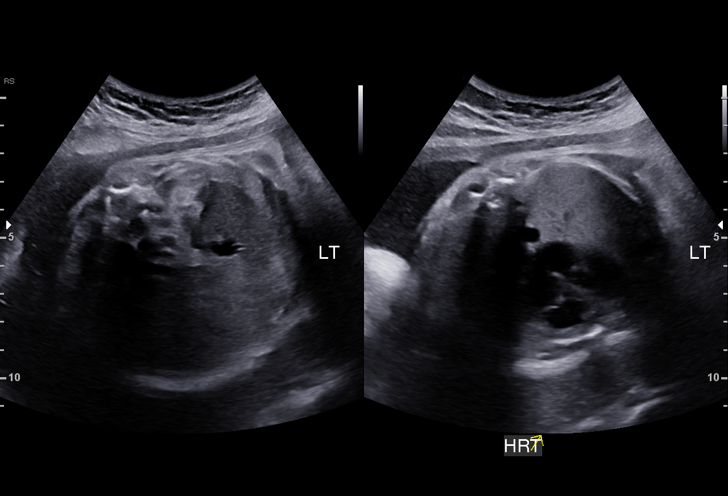
[im 64/133]
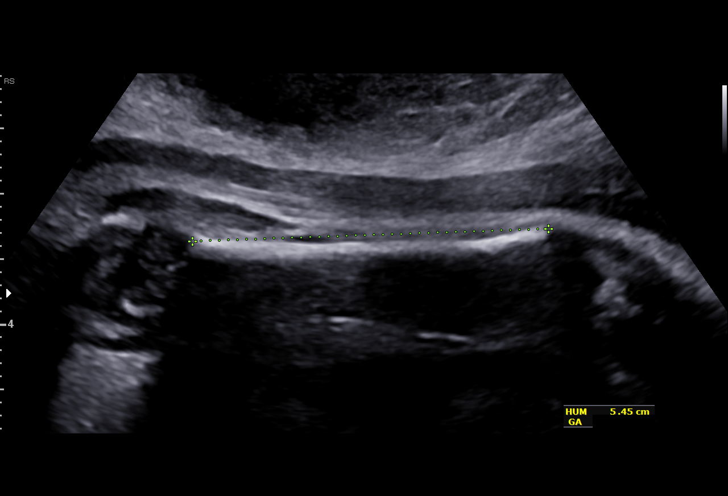
[im 74/133]
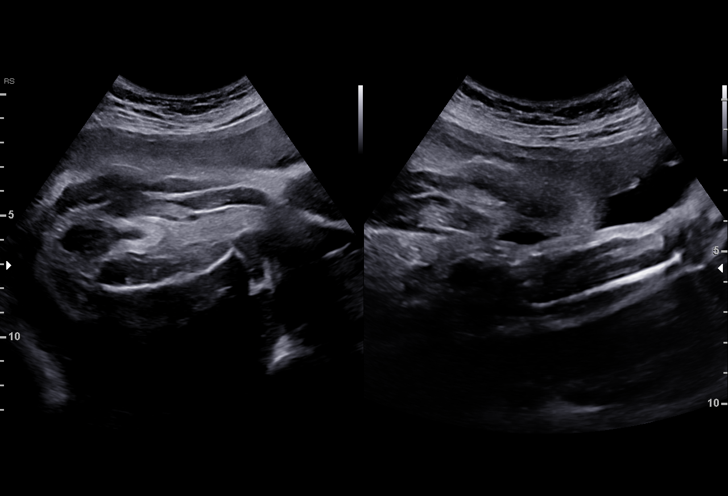
[im 84/133]
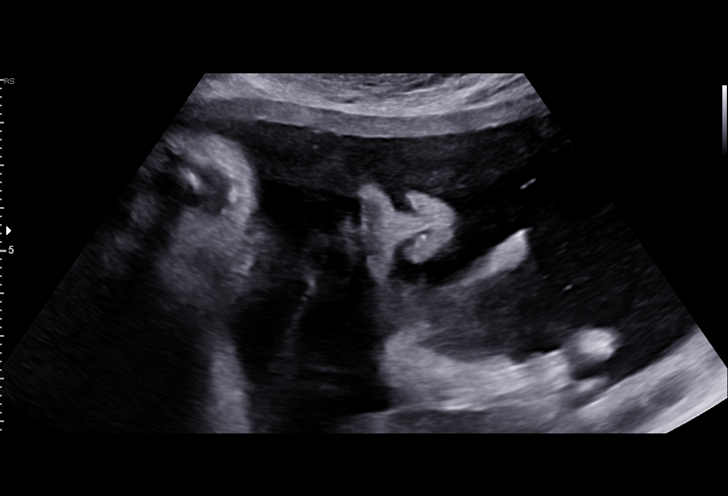
[im 93/133]
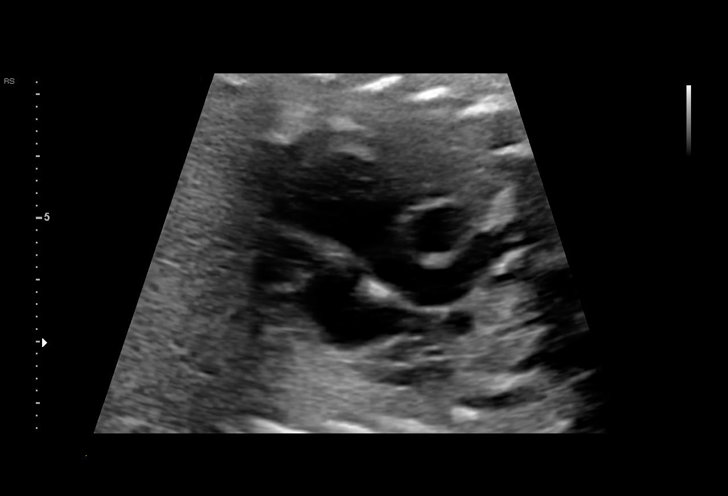
[im 103/133]
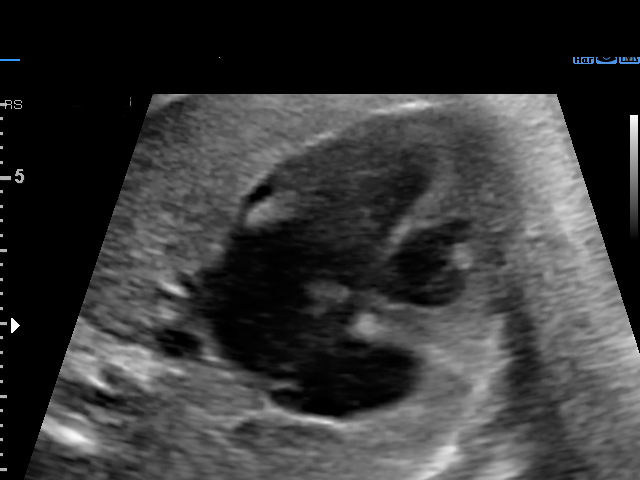
[im 113/133]
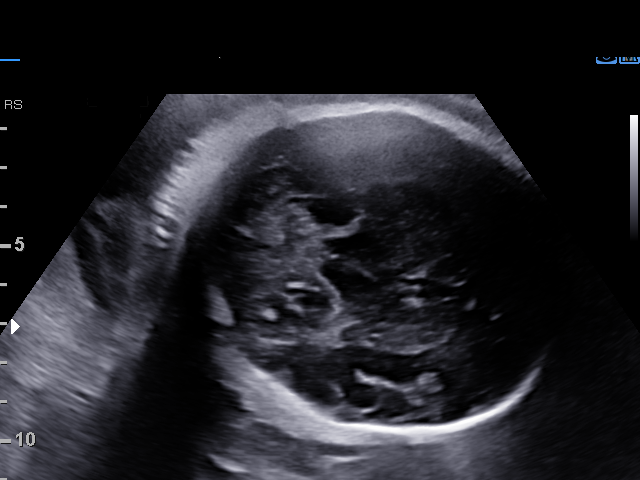
[im 123/133]
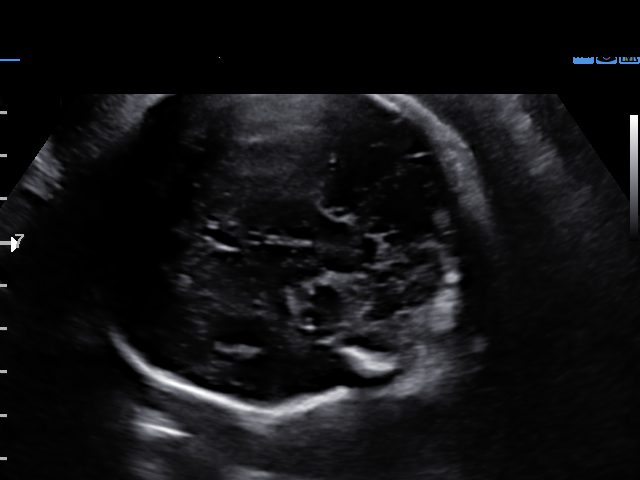
[im 133/133]
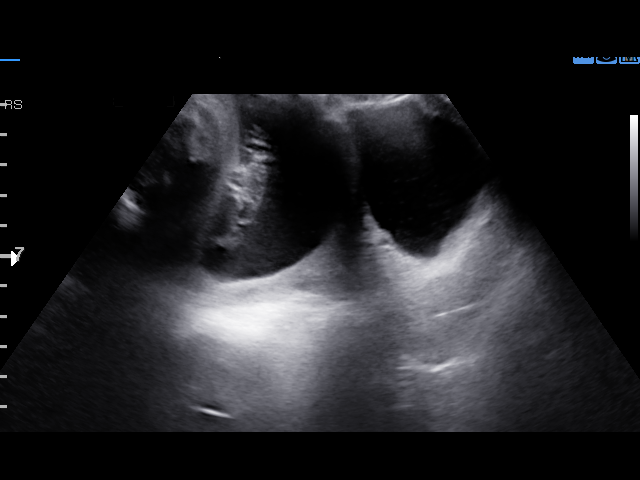

[14 of 28 positions shown; findings below may reference images not displayed]

[facility]

OB/Gyn Clinic

1  LARIBI OGLO            [id number]      [id number]     [id number]
2  LARIBI OGLO            [id number]      [id number]     [id number]
Indications

33 weeks gestation of pregnancy
Encounter for antenatal screening for
malformations
Gestational diabetes in pregnancy,
controlled by oral hypoglycemic drugs
OB History

Blood Type:            Height:  4'6"   Weight (lb):  132       BMI:
Gravidity:    3         Term:   2        Prem:   0        SAB:   0
TOP:          0       Ectopic:  0        Living: 2
Fetal Evaluation

Num Of Fetuses:     1
Fetal Heart         157
Rate(bpm):
Cardiac Activity:   Observed
Presentation:       Cephalic
Placenta:           Posterior Fundal, above cervical os
P. Cord Insertion:  Visualized

Amniotic Fluid
AFI FV:      Subjectively within normal limits
AFI Sum(cm)     %Tile       Largest Pocket(cm)
19.09           71

RUQ(cm)       RLQ(cm)       LUQ(cm)        LLQ(cm)
7.92
Biophysical Evaluation

Amniotic F.V:   Pocket => 2 cm two         F. Tone:        Observed
planes
F. Movement:    Observed                   Score:          [DATE]
F. Breathing:   Observed
Biometry

BPD:      83.2  mm     G. Age:  33w 3d         54  %    CI:        75.21   %    70 - 86
FL/HC:      20.0   %    19.9 -
HC:      304.3  mm     G. Age:  33w 6d         31  %    HC/AC:      0.99        0.96 -
AC:      306.4  mm     G. Age:  34w 4d         87  %    FL/BPD:     73.3   %    71 - 87
FL:         61  mm     G. Age:  31w 5d          9  %    FL/AC:      19.9   %    20 - 24
HUM:      53.9  mm     G. Age:  31w 2d         23  %

Est. FW:    4466  gm    4 lb 15 oz      68  %
Gestational Age

LMP:           33w 1d        Date:  06/15/16                 EDD:   03/22/17
U/S Today:     33w 3d                                        EDD:   03/20/17
Best:          33w 1d     Det. By:  LMP  (06/15/16)          EDD:   03/22/17
Anatomy

Cranium:               Appears normal         Aortic Arch:            Appears normal
Cavum:                 Appears normal         Ductal Arch:            Appears normal
Ventricles:            Appears normal         Diaphragm:              Appears normal
Choroid Plexus:        Appears normal         Stomach:                Appears normal, left
sided
Cerebellum:            Appears normal         Abdomen:                Appears normal
Posterior Fossa:       Appears normal         Abdominal Wall:         Appears nml (cord
insert, abd wall)
Nuchal Fold:           Not applicable (>20    Cord Vessels:           Appears normal (3
wks GA)                                        vessel cord)
Face:                  Orbits nl; profile not Kidneys:                Appear normal
well visualized
Lips:                  Appears normal         Bladder:                Appears normal
Thoracic:              Appears normal         Spine:                  Limited views
appear normal
Heart:                 Appears normal         Upper Extremities:      LUE vis, RUE not
(4CH, axis, and                                well vis
situs)
RVOT:                  Appears normal         Lower Extremities:      Appears normal
LVOT:                  Appears normal

Other:  Fetus appears to be a female. Technically difficult due to advanced
gestational age.
Cervix Uterus Adnexa

Cervix
Not visualized (advanced GA >85wks)
Uterus
No abnormality visualized.

Left Ovary
Not visualized.

Right Ovary
Not visualized.

Adnexa:       No abnormality visualized. No adnexal mass
visualized.
Impression

SIUP at 33+1 weeks
Normal detailed fetal anatomy; limited views of profile and
RUE
Normal amniotic fluid volume
Measurements consistent with LMP dating; EFW at the 68th
%tile
BPP [DATE]
Recommendations

Continue antenatal testing.

## 2019-05-30 IMAGING — US US FETAL BPP W/ NON-STRESS
1 series · 13 of 13 positions shown · non-contrast
Comparison: none

[Series 1: us fetal bpp w/nonstress · 13 acquisitions, 13 frames shown]
[im 1/13]
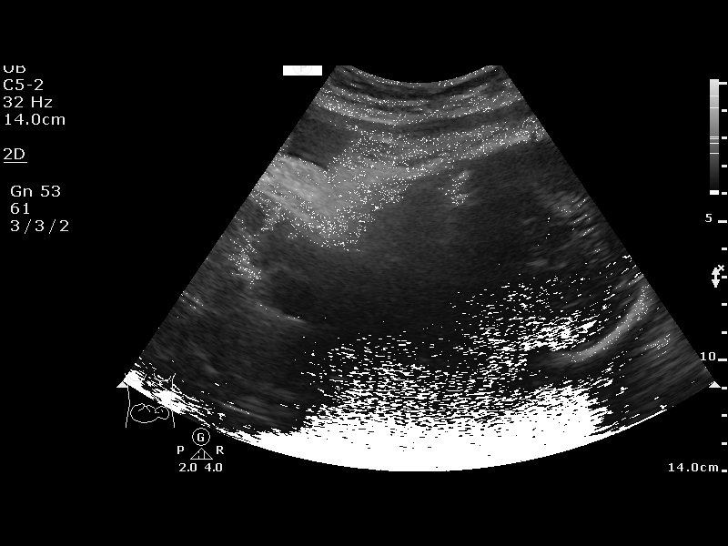
[im 2/13]
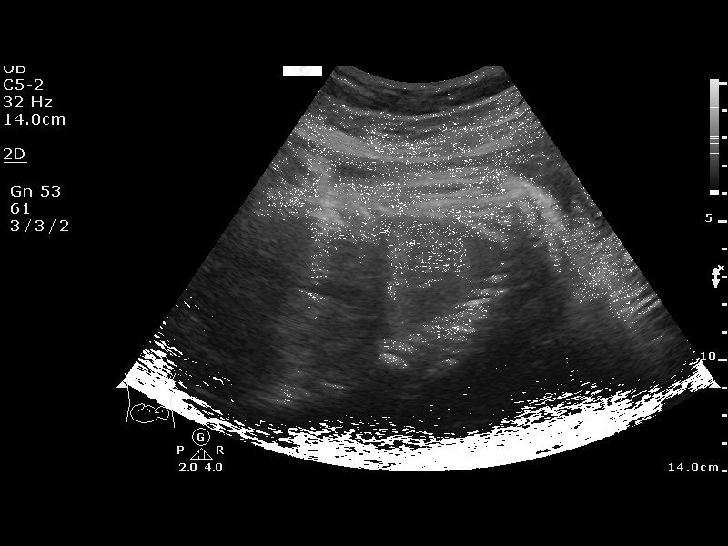
[im 3/13]
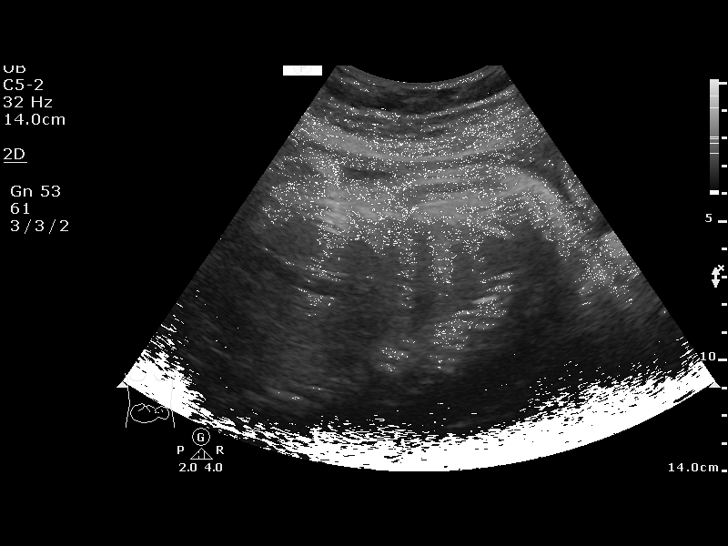
[im 4/13]
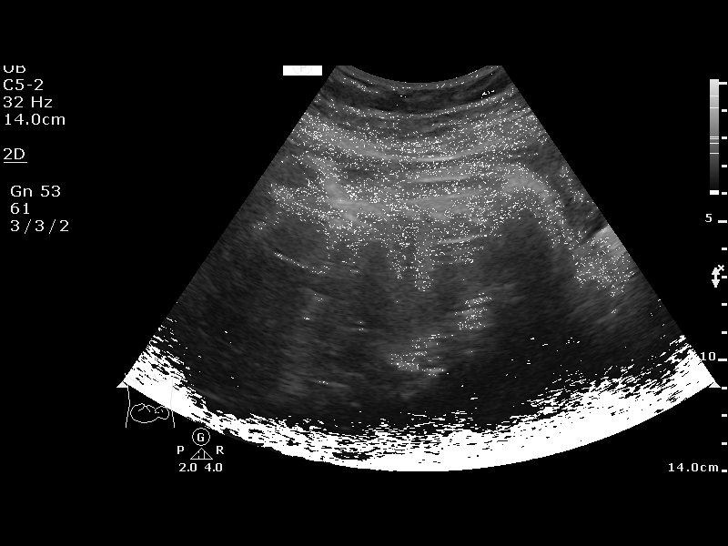
[im 5/13]
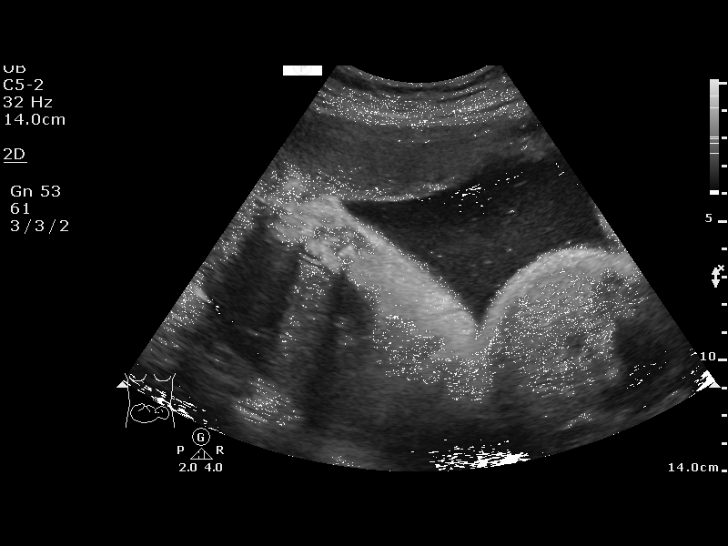
[im 6/13]
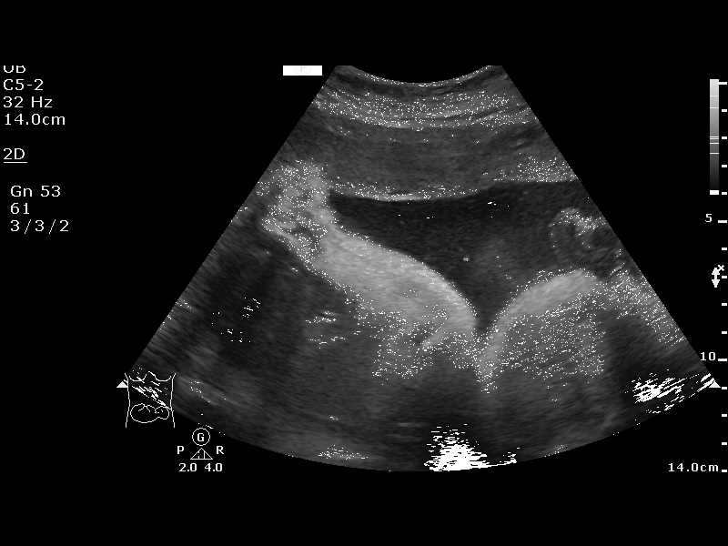
[im 7/13]
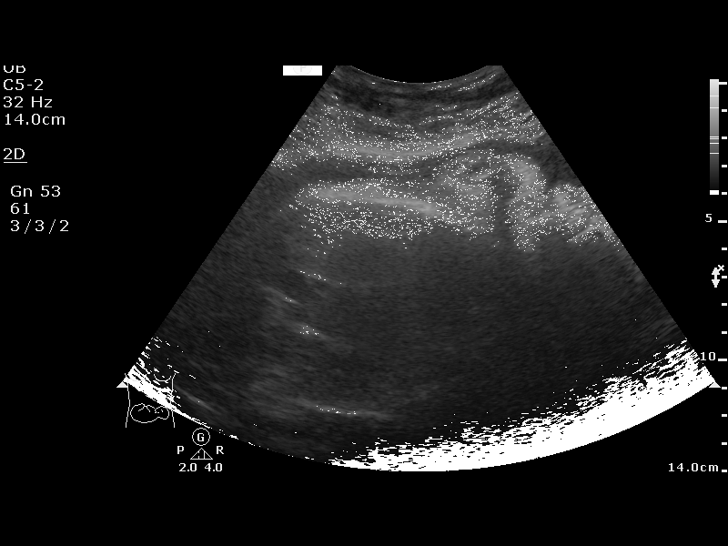
[im 8/13]
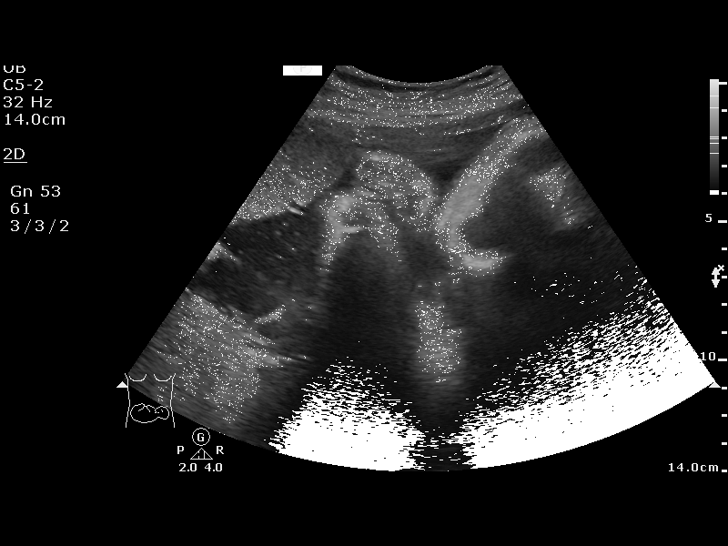
[im 9/13]
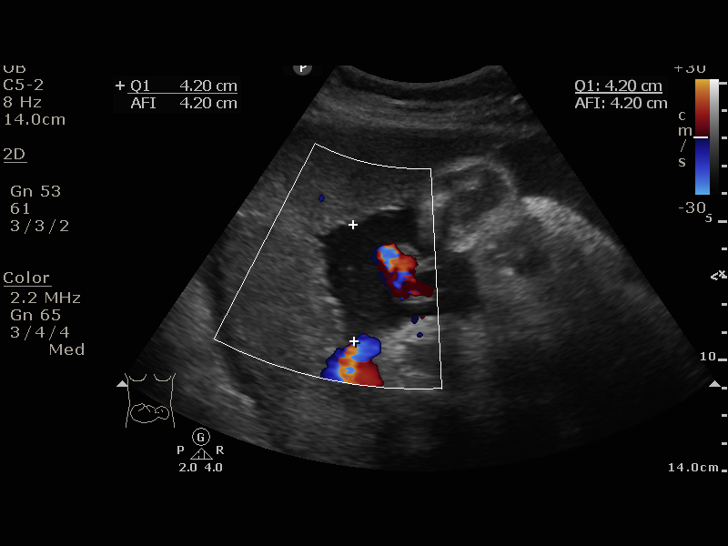
[im 10/13]
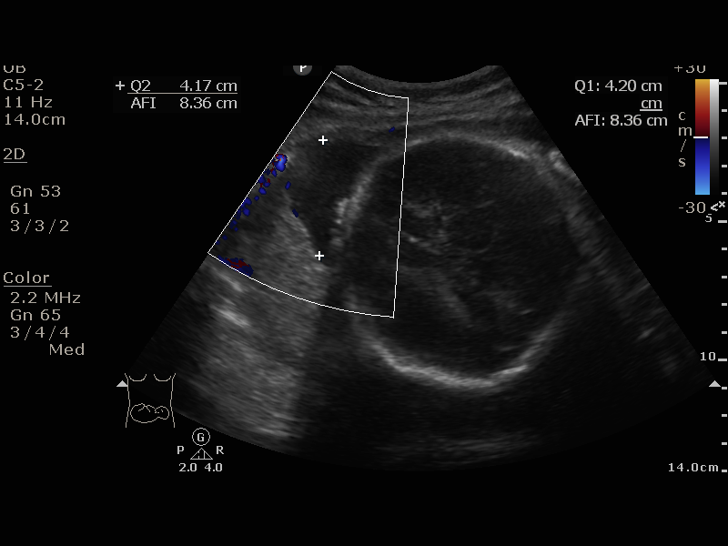
[im 11/13]
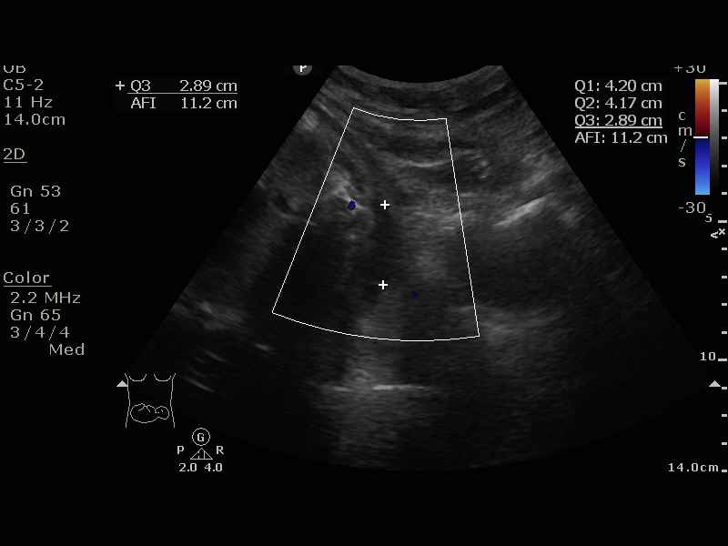
[im 12/13]
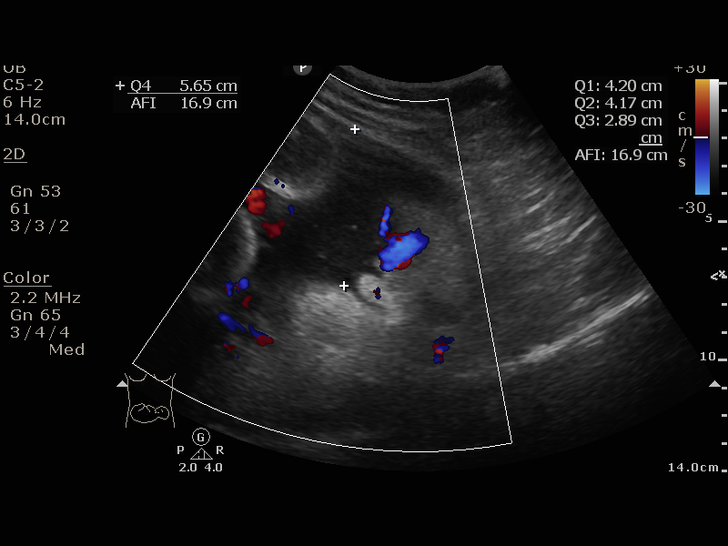
[im 13/13]
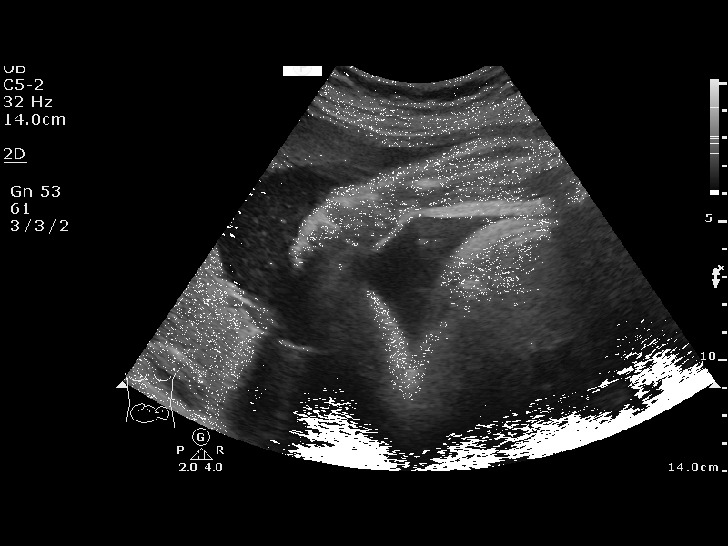

[13 of 13 positions shown; findings below may reference images not displayed]

STIVEN CARLOS

OB/Gyn Clinic
Women's
[REDACTED]

1  US FETAL BPP W/NONSTRESS                    76818.4

1  DAIRON SUBERO            [id number]      [id number]     [id number]
Service(s) Provided

Indications

34 weeks gestation of pregnancy
Gestational diabetes in pregnancy,
controlled by oral hypoglycemic drugs
OB History

Blood Type:            Height:  4'6"   Weight (lb):  132       BMI:
Gravidity:    3         Term:   2        Prem:   0        SAB:   0
TOP:          0       Ectopic:  0        Living: 2
Fetal Evaluation

Num Of Fetuses:     1
Preg. Location:     Intrauterine
Cardiac Activity:   Observed
Presentation:       Transverse, head to maternal left

Amniotic Fluid
AFI FV:      Subjectively within normal limits

AFI Sum(cm)     %Tile       Largest Pocket(cm)
16.91           62
RUQ(cm)       RLQ(cm)       LUQ(cm)        LLQ(cm)
4.2
Biophysical Evaluation

Amniotic F.V:   Pocket => 2 cm two         F. Tone:        Observed
planes
F. Movement:    Observed                   N.S.T:          Reactive
F. Breathing:   Observed                   Score:          [DATE]
Gestational Age

LMP:           34w 1d        Date:  06/15/16                 EDD:   03/22/17
Best:          34w 1d     Det. By:  LMP  (06/15/16)          EDD:   03/22/17
Impression

IUP at  03w0d with T2FY5
Normal amniotic fluid volume
Recommendations

Continue antenatal testing.
Recommend delivery by 39 weeks if maternal and fetal status
remain reassuring.

## 2019-06-06 IMAGING — US US FETAL BPP W/ NON-STRESS
1 series · 10 of 10 positions shown · non-contrast
Comparison: none

[Series 1: us fetal bpp w/nonstress · 10 acquisitions, 10 frames shown]
[im 1/10]
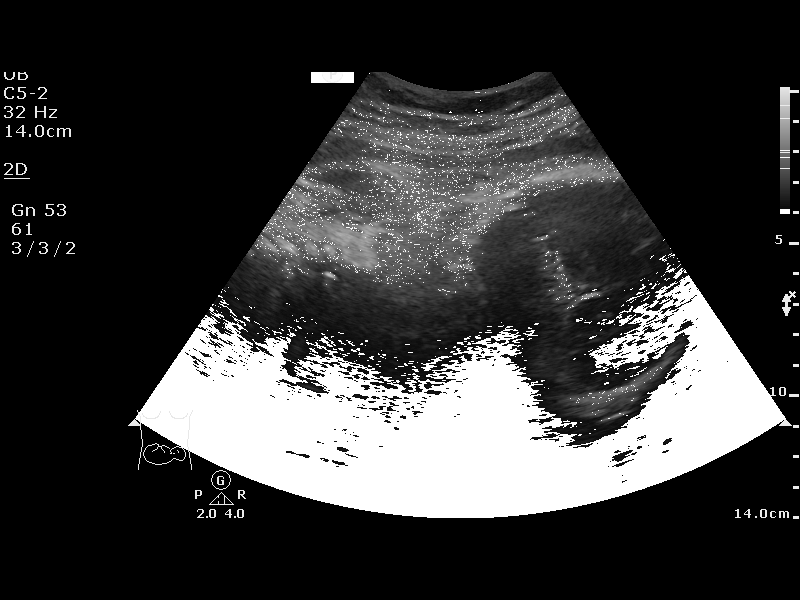
[im 2/10]
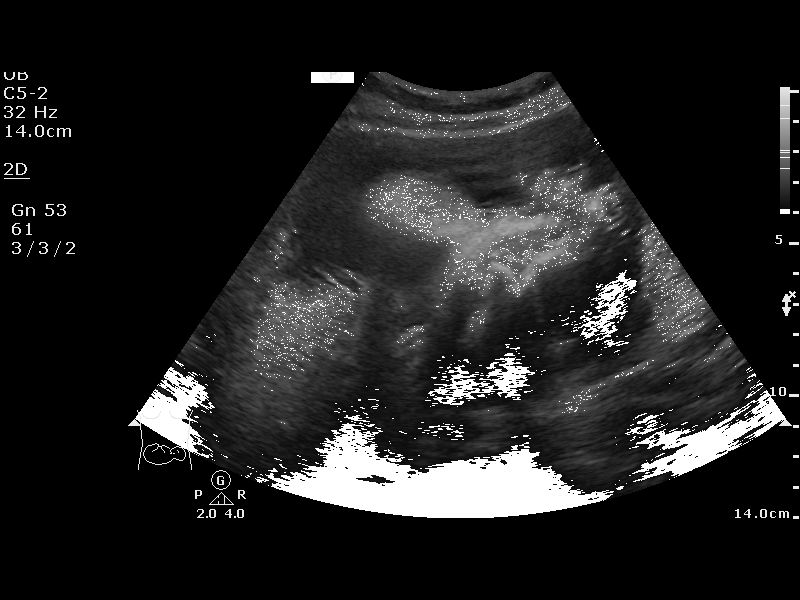
[im 3/10]
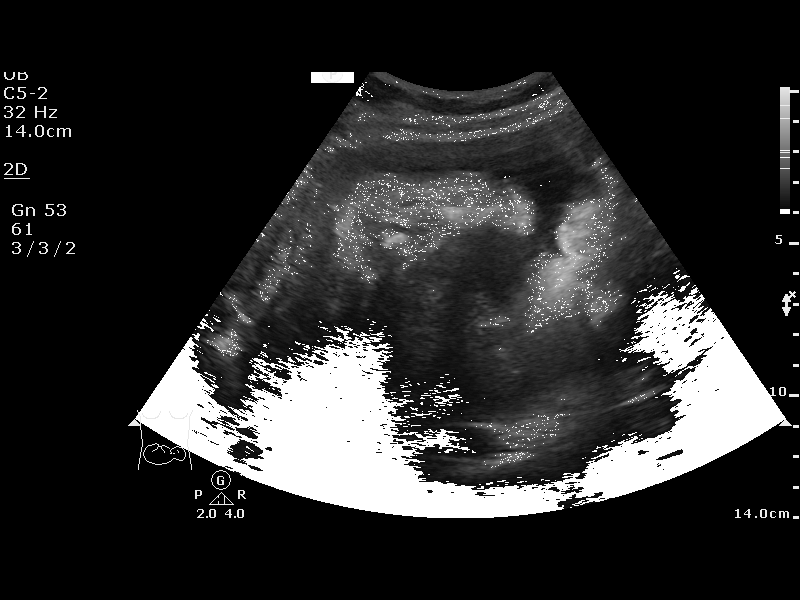
[im 4/10]
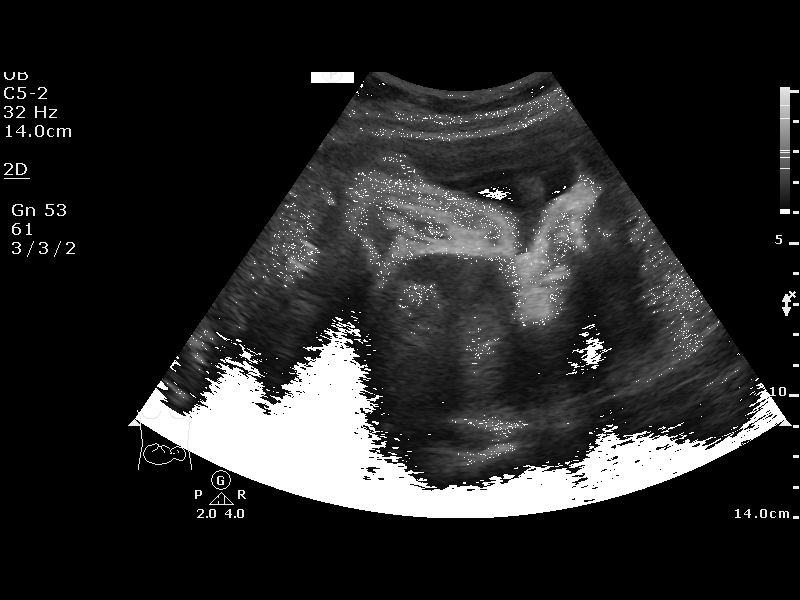
[im 5/10]
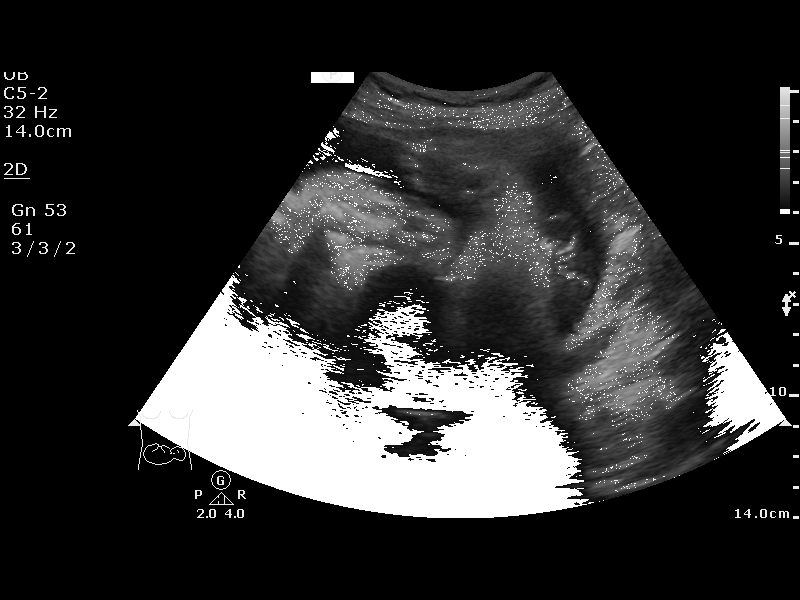
[im 6/10]
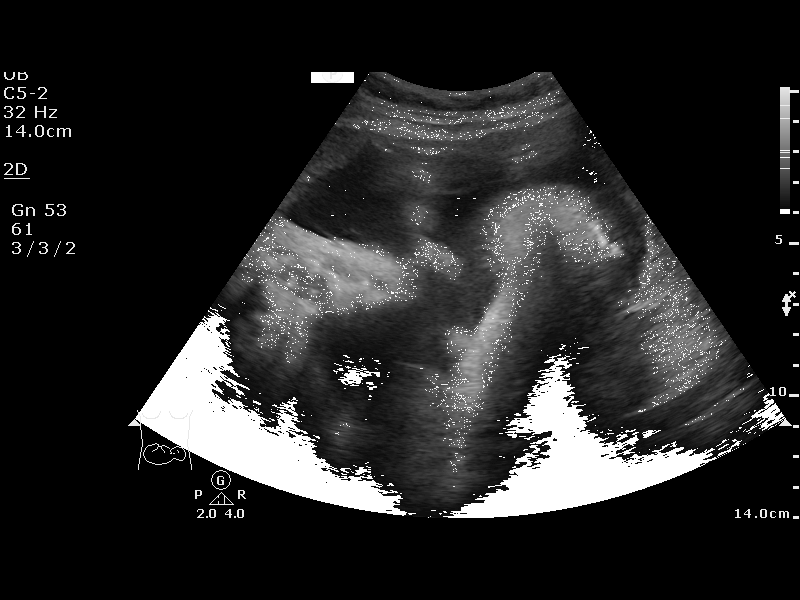
[im 7/10]
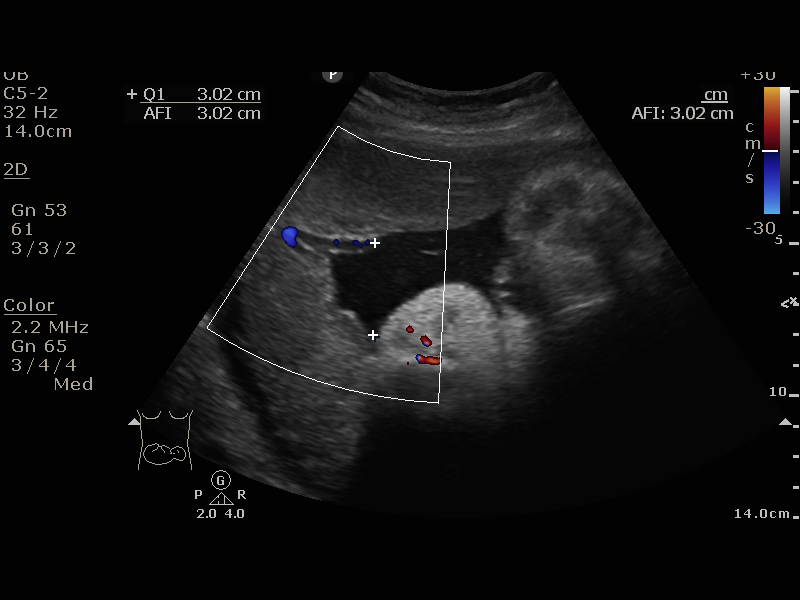
[im 8/10]
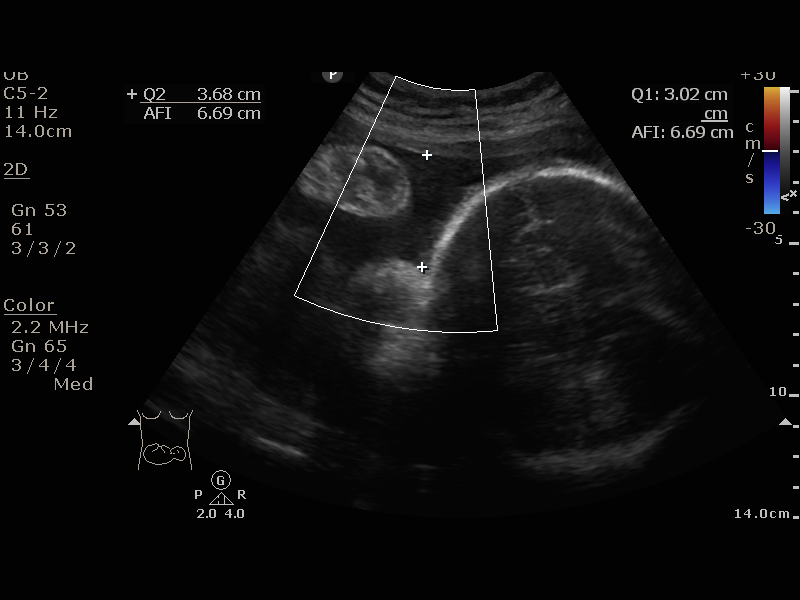
[im 9/10]
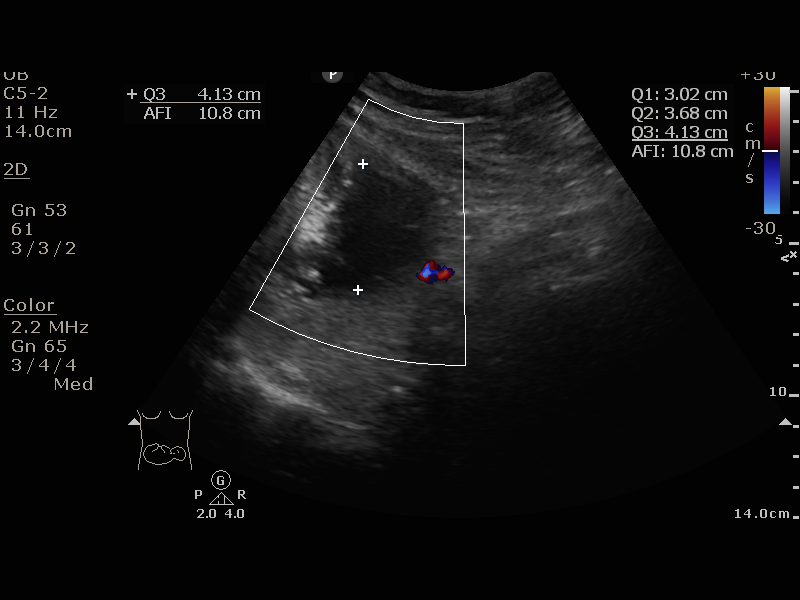
[im 10/10]
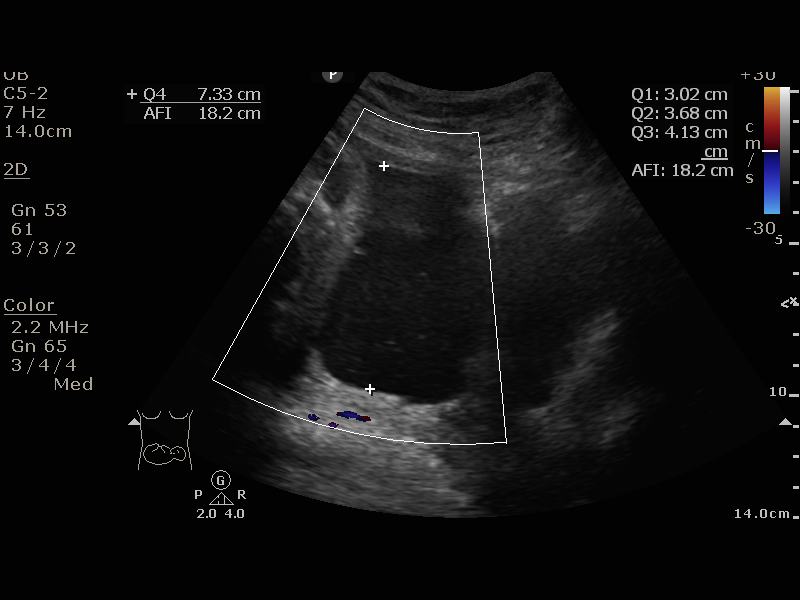

[10 of 10 positions shown; findings below may reference images not displayed]

[facility]

OB/Gyn Clinic
Women's
[REDACTED]

1  US FETAL BPP W/NONSTRESS                    76818.4

1  JVLIO TOYAMA          [id number]      [id number]     [id number]
Service(s) Provided

Indications

35 weeks gestation of pregnancy
Gestational diabetes in pregnancy,
controlled by oral hypoglycemic drugs
OB History

Blood Type:            Height:  4'6"   Weight (lb):  132       BMI:
Gravidity:    3         Term:   2        Prem:   0        SAB:   0
TOP:          0       Ectopic:  0        Living: 2
Fetal Evaluation

Num Of Fetuses:     1
Preg. Location:     Intrauterine
Cardiac Activity:   Observed
Presentation:       Transverse, head to maternal left

Amniotic Fluid
AFI FV:      Subjectively within normal limits

AFI Sum(cm)     %Tile       Largest Pocket(cm)
18.16           67
RUQ(cm)       RLQ(cm)       LUQ(cm)        LLQ(cm)
3.02
Biophysical Evaluation

Amniotic F.V:   Pocket => 2 cm two         F. Tone:        Observed
planes
F. Movement:    Observed                   N.S.T:          Reactive
F. Breathing:   Not Observed               Score:          [DATE]
Gestational Age

LMP:           35w 1d        Date:  06/15/16                 EDD:   03/22/17
Best:          35w 1d     Det. By:  LMP  (06/15/16)          EDD:   03/22/17
Impression

Reassuring AP testing
Recommendations

Follow up with patient next week re: presentation.

## 2019-06-13 IMAGING — US US FETAL BPP W/ NON-STRESS
1 series · 13 of 13 positions shown · non-contrast
Comparison: none

[Series 1: us fetal bpp w/nonstress · 13 acquisitions, 13 frames shown]
[im 1/13]
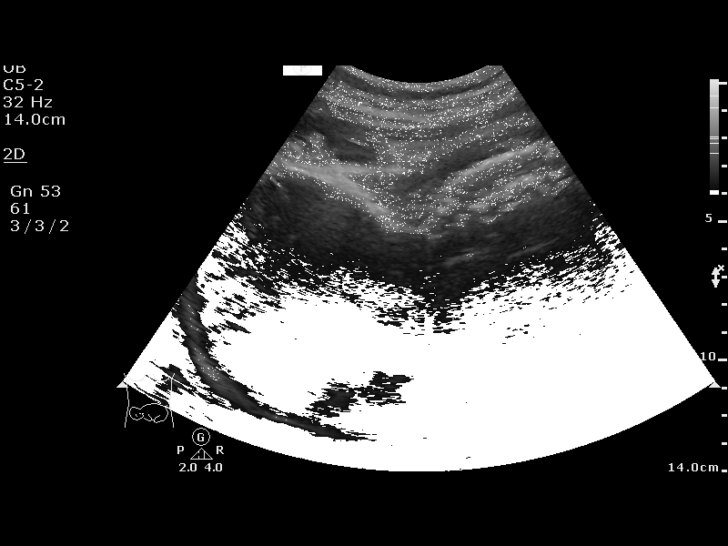
[im 2/13]
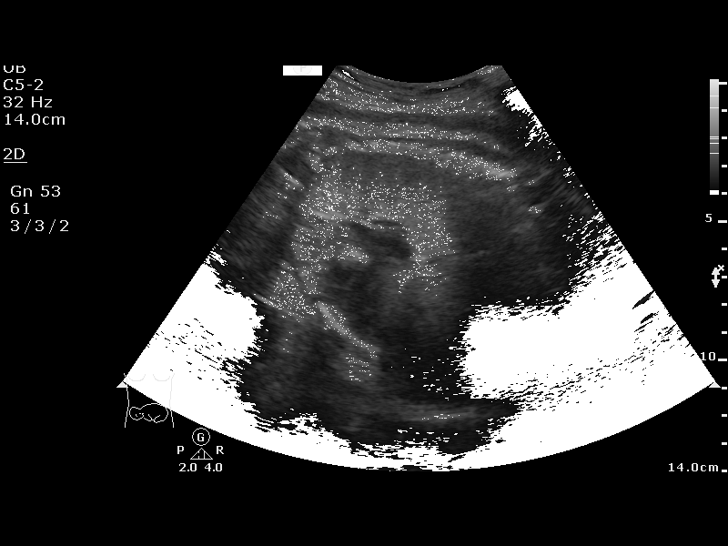
[im 3/13]
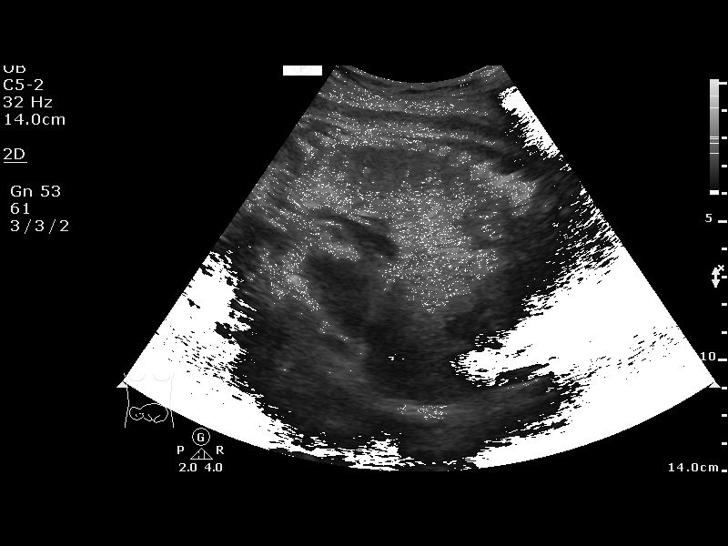
[im 4/13]
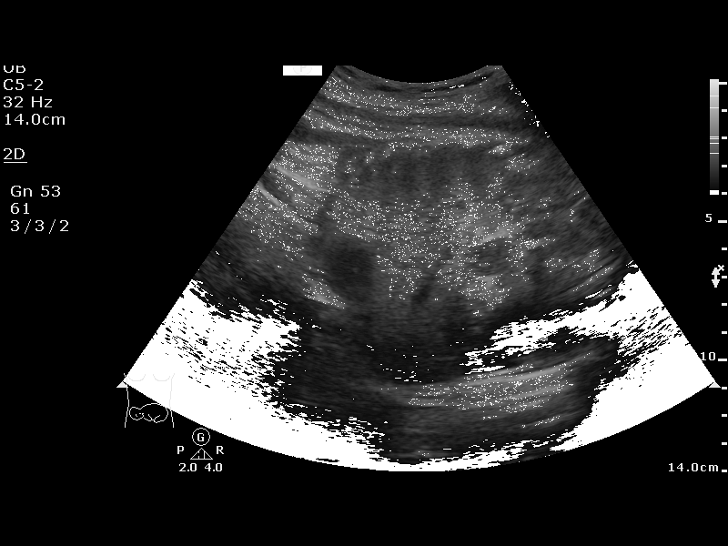
[im 5/13]
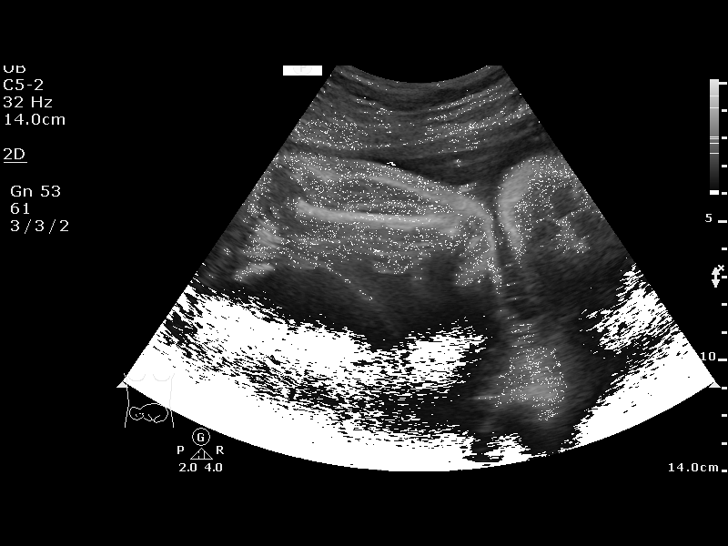
[im 6/13]
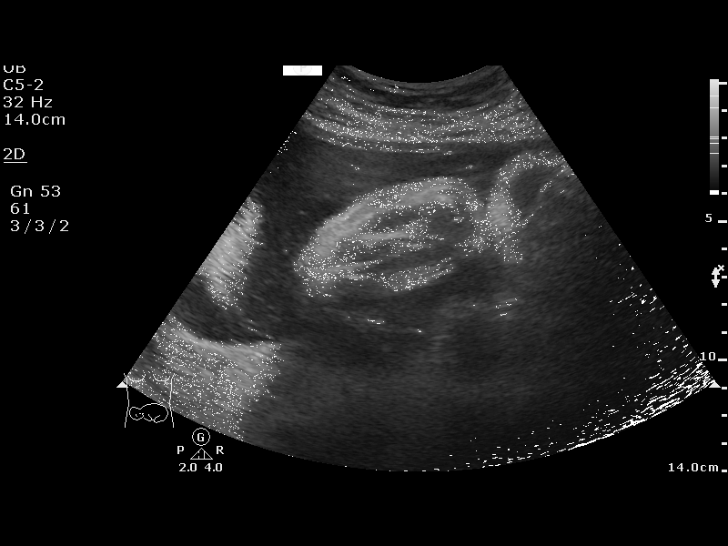
[im 7/13]
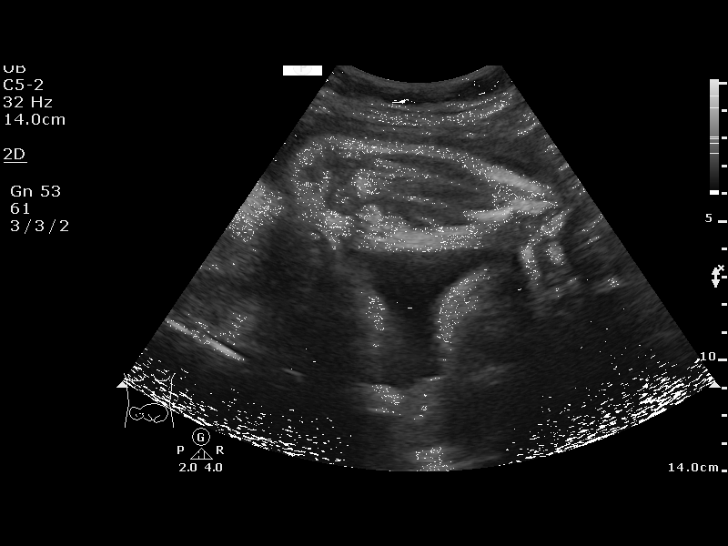
[im 8/13]
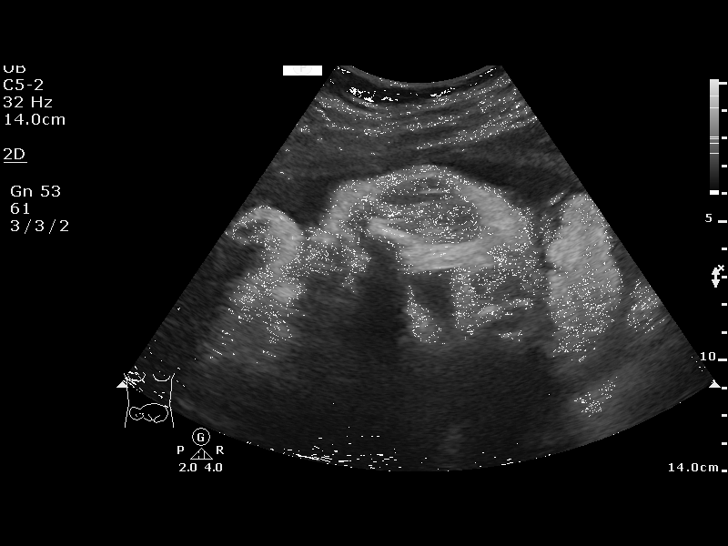
[im 9/13]
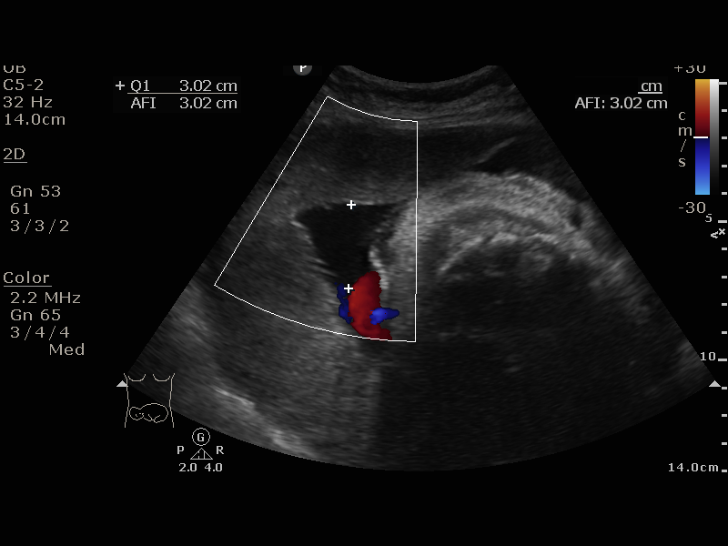
[im 10/13]
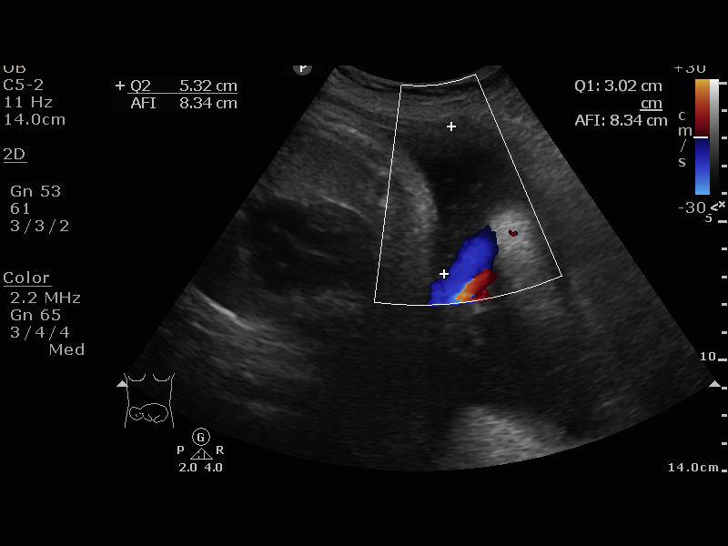
[im 11/13]
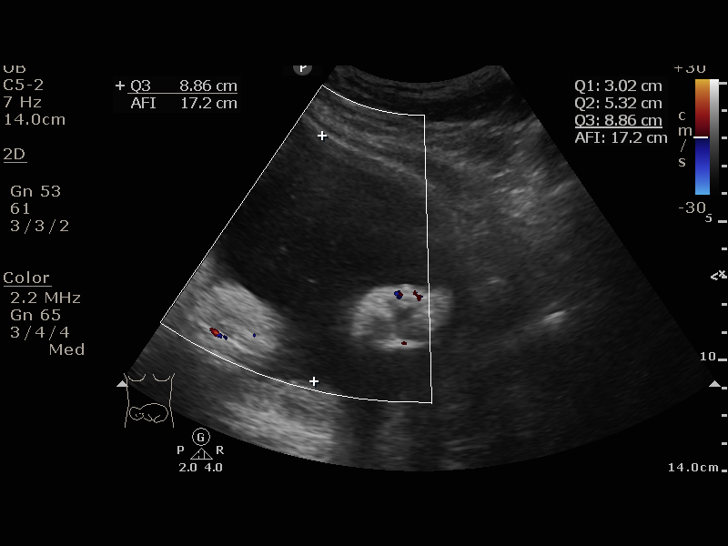
[im 12/13]
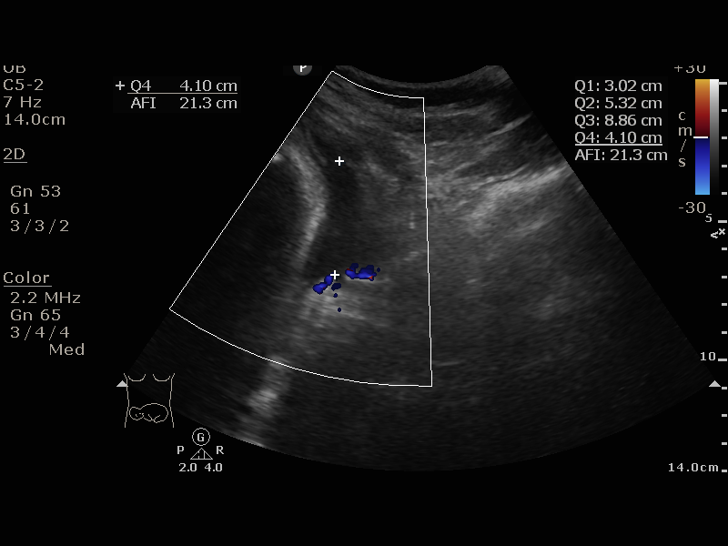
[im 13/13]
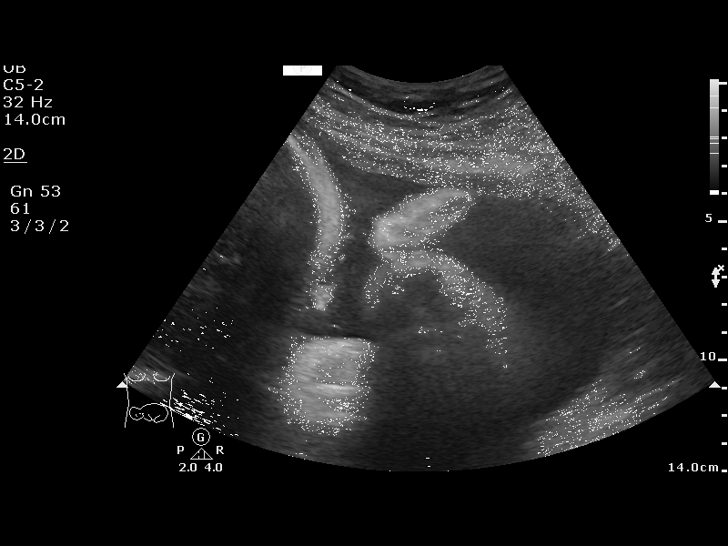

[13 of 13 positions shown; findings below may reference images not displayed]

[facility]

OB/Gyn Clinic
Women's
[REDACTED]

1  US FETAL BPP W/NONSTRESS                    76818.4

1  YLERMI MOSQUEDA            [id number]      [id number]     [id number]
Service(s) Provided

Indications

36 weeks gestation of pregnancy
Gestational diabetes in pregnancy,
controlled by oral hypoglycemic drugs
OB History

Blood Type:            Height:  4'6"   Weight (lb):  132       BMI:
Gravidity:    3         Term:   2        Prem:   0        SAB:   0
TOP:          0       Ectopic:  0        Living: 2
Fetal Evaluation

Num Of Fetuses:     1
Preg. Location:     Intrauterine
Cardiac Activity:   Observed
Presentation:       Transverse, head to maternal right

Amniotic Fluid
AFI FV:      Subjectively upper-normal

AFI Sum(cm)     %Tile       Largest Pocket(cm)
21.3            81
RUQ(cm)       RLQ(cm)       LUQ(cm)        LLQ(cm)
3.02
Biophysical Evaluation

Amniotic F.V:   Pocket => 2 cm two         F. Tone:        Observed
planes
F. Movement:    Observed                   N.S.T:          Reactive
F. Breathing:   Observed                   Score:          [DATE]
Gestational Age

LMP:           36w 1d        Date:  06/15/16                 EDD:   03/22/17
Best:          36w 1d     Det. By:  LMP  (06/15/16)          EDD:   03/22/17
Impression

IUP at  34w5d with BPSNK
Normal amniotic fluid volume
Recommendations

Continue antenatal testing.
Recommend delivery by 39 weeks if maternal and fetal status
remain reassuring.

## 2020-02-14 ENCOUNTER — Encounter: Payer: Self-pay | Admitting: Emergency Medicine

## 2020-02-14 ENCOUNTER — Emergency Department: Payer: Self-pay

## 2020-02-14 ENCOUNTER — Other Ambulatory Visit: Payer: Self-pay

## 2020-02-14 ENCOUNTER — Emergency Department
Admission: EM | Admit: 2020-02-14 | Discharge: 2020-02-14 | Disposition: A | Discharge status: Home / Self Care | Payer: Self-pay | Attending: Emergency Medicine | Admitting: Emergency Medicine

## 2020-02-14 DIAGNOSIS — N949 Unspecified condition associated with female genital organs and menstrual cycle: Secondary | ICD-10-CM

## 2020-02-14 DIAGNOSIS — N83291 Other ovarian cyst, right side: Secondary | ICD-10-CM | POA: Insufficient documentation

## 2020-02-14 LAB — LIPASE, BLOOD: Lipase: 31 U/L (ref 11–51)

## 2020-02-14 LAB — CBC
HCT: 39.2 % (ref 36.0–46.0)
Hemoglobin: 14.1 g/dL (ref 12.0–15.0)
MCH: 31 pg (ref 26.0–34.0)
MCHC: 36 g/dL (ref 30.0–36.0)
MCV: 86.2 fL (ref 80.0–100.0)
Platelets: 182 10*3/uL (ref 150–400)
RBC: 4.55 MIL/uL (ref 3.87–5.11)
RDW: 12.7 % (ref 11.5–15.5)
WBC: 4.8 10*3/uL (ref 4.0–10.5)
nRBC: 0 % (ref 0.0–0.2)

## 2020-02-14 LAB — URINALYSIS, COMPLETE (UACMP) WITH MICROSCOPIC
Bacteria, UA: NONE SEEN
Bilirubin Urine: NEGATIVE
Glucose, UA: NEGATIVE mg/dL
Ketones, ur: NEGATIVE mg/dL
Leukocytes,Ua: NEGATIVE
Nitrite: NEGATIVE
Protein, ur: NEGATIVE mg/dL
Specific Gravity, Urine: 1.02 (ref 1.005–1.030)
pH: 7 (ref 5.0–8.0)

## 2020-02-14 LAB — COMPREHENSIVE METABOLIC PANEL
ALT: 36 U/L (ref 0–44)
AST: 30 U/L (ref 15–41)
Albumin: 4.1 g/dL (ref 3.5–5.0)
Alkaline Phosphatase: 65 U/L (ref 38–126)
Anion gap: 7 (ref 5–15)
BUN: 15 mg/dL (ref 6–20)
CO2: 22 mmol/L (ref 22–32)
Calcium: 8.8 mg/dL — ABNORMAL LOW (ref 8.9–10.3)
Chloride: 107 mmol/L (ref 98–111)
Creatinine, Ser: 0.61 mg/dL (ref 0.44–1.00)
GFR, Estimated: >60 mL/min (ref >60)
Glucose, Bld: 113 mg/dL — ABNORMAL HIGH (ref 70–99)
Potassium: 3.7 mmol/L (ref 3.5–5.1)
Sodium: 136 mmol/L (ref 135–145)
Total Bilirubin: 0.6 mg/dL (ref 0.3–1.2)
Total Protein: 7.4 g/dL (ref 6.5–8.1)

## 2020-02-14 LAB — POC URINE PREG, ED: Preg Test, Ur: NEGATIVE

## 2020-02-14 MED ORDER — ONDANSETRON 4 MG PO TBDP
4.0000 mg | ORAL_TABLET | Freq: Once | ORAL | Status: AC
  Administered 2020-02-14: 4 mg via ORAL
  Filled 2020-02-14: qty 1

## 2020-02-14 NOTE — ED Notes (Signed)
Pt taken to CT at this time.

## 2020-02-14 NOTE — ED Triage Notes (Signed)
Pt to ED via POV with c/o RLQ abodminal pain. Pt states was referred by PCP for concerns regarding appendicitis. Pt c/o pain x 1 week. States pain 7/10 at this time, also c/o vomiting at this time.   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   Ronnald Collum, interpreter in triage at this time.

## 2020-02-14 NOTE — Discharge Instructions (Addendum)
Call and make an appointment with your primary care provider at [facility] for a Pap smear and follow-up of your adnexal cyst.  You may take Tylenol or ibuprofen if needed.

## 2020-02-14 NOTE — ED Provider Notes (Signed)
[facility] Emergency Department Provider Note  ____________________________________________   Event Date/Time   First MD Initiated Contact with Patient 02/14/20 1226     (approximate)  I have reviewed the triage vital signs and the nursing notes.   HISTORY  Chief Complaint Abdominal Pain   HPI Amber Harper is a 35 y.o. female presents to the ED with complaint of right lower quadrant pain. Patient states that she had same pain for the past week with some nausea and had vomiting this morning. She denies any urinary symptoms but does state that she noticed some pain on the right  side of her back prior to her pain. Patient reports normal bowel movements this past week including this morning. Patient denies any fever, chills, nausea or vomiting. She called her PCP who sent her to the emergency department with concerns of appendicitis. She rates her pain as 7 out of 10.     Past Medical History:  Diagnosis Date  . Arthritis   . Constipation 07/04/2012   Overview:  Last Assessment & Plan:  Constipation symptoms as well as associated abdominal pain has resolved with Colace and MiraLAX. Now having regular bowel movements, approximately 1-2 a day which are soft and do not require straining. Continue medications as previously directed and return for follow-up as needed for any new or worsening symptoms.  . Gestational diabetes    glyburide  . Headache(784.0)   . 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  Prolactin drawn as per request of endocrinologist.  Levels <400 are nml (up to date) although testing in pregnancy is not conclusive.  Above 400--visual field testing.    Patient Active Problem List   Diagnosis Date Noted  . History of gestational diabetes mellitus (GDM) 04/20/2017  . History of postpartum hemorrhage 04/20/2017  . Language barrier 02/16/2017  . 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 CESAREAN SECTION     10 years ago and do NOT know why  . MASS EXCISION Left 09/14/2012   Procedure: EXCISION MASS;  Surgeon: Ruby Cola, MD;  Location: [facility] OR;  Service: ENT;  Laterality: Left;  removal of tongue mass    Prior to Admission medications   Medication Sig Start Date End Date Taking? Authorizing Provider  docusate sodium (COLACE) 100 MG capsule Take 100 mg by mouth 2 (two) times daily.    [provider]  ferrous sulfate 325 (65 FE) MG tablet Take 1 tablet (325 mg total) by mouth 2 (two) times daily with a meal. 03/13/17   Laury Deep, CNM  Prenatal Multivit-Min-Fe-FA (PRENATAL VITAMINS) 0.8 MG tablet Take 1 tablet by mouth daily. 08/16/16   Starr Lake, CNM    Allergies Patient has no known allergies.  Family History  Problem Relation Age of Onset  . Diabetes Mother   . Diabetes Father   . Diabetes Maternal Grandmother   . Diabetes Paternal Grandmother   . Diabetes Maternal Uncle   . Diabetes Paternal Uncle   . Diabetes Maternal Grandfather   . Diabetes Paternal Grandfather     Social History Social History   Tobacco Use  . Smoking status: Never Smoker  . Smokeless tobacco: Never Used  Substance Use Topics  . Alcohol use: No  . Drug use: No    Review of Systems Constitutional: No fever/chills Eyes: No visual changes. ENT: No sore throat. Cardiovascular: Denies chest pain. Respiratory: Denies shortness of breath. Gastrointestinal: Positive abdominal pain.  Positive nausea, positive vomiting.  No diarrhea.  No constipation. Genitourinary: Negative for dysuria.  Positive menses 4 days ago. Musculoskeletal: Negative for back pain. Skin: Negative for rash. Neurological: Negative for headaches, focal weakness or numbness.  ____________________________________________   PHYSICAL EXAM:  VITAL SIGNS: ED Triage Vitals  Enc Vitals Group     BP 02/14/20 1133 99/74     Pulse Rate 02/14/20 1133 60     Resp  02/14/20 1133 18     Temp 02/14/20 1133 98.4 F (36.9 C)     Temp Source 02/14/20 1133 Oral     SpO2 02/14/20 1133 100 %     Weight 02/14/20 1132 140 lb (63.5 kg)     Height 02/14/20 1132 _0  (1.372 m)     Head Circumference --      Peak Flow --      Pain Score 02/14/20 1141 7     Pain Loc --      Pain Edu? --      Excl. in [place]? --    Constitutional: Alert and oriented. Well appearing and in no acute distress. Eyes: Conjunctivae are normal.  Head: Atraumatic. Neck: No stridor.   Cardiovascular: Normal rate, regular rhythm. Grossly normal heart sounds.  Good peripheral circulation. Respiratory: Normal respiratory effort.  No retractions. Lungs CTAB. Gastrointestinal: Soft  with diffuse tenderness right lower quadrant. No McBurney's point tenderness or rebound.  No tenderness suprapubic area.  Bowel sounds are normoactive x4 quadrants. No distention.  No CVA tenderness. Musculoskeletal: Moves upper and lower extremities without any difficulty and patient is ambulatory in the room without any assistance. Neurologic:  Normal speech and language. No gross focal neurologic deficits are appreciated. No gait instability. Skin:  Skin is warm, dry and intact. No rash noted. Psychiatric: Mood and affect are normal. Speech and behavior are normal.  ____________________________________________   LABS (all labs ordered are listed, but only abnormal results are displayed)  Labs Reviewed  COMPREHENSIVE METABOLIC PANEL - Abnormal; Notable for the following components:      Result Value   Glucose, Bld 113 (*)    Calcium 8.8 (*)    All other components within normal limits  URINALYSIS, COMPLETE (UACMP) WITH MICROSCOPIC - Abnormal; Notable for the following components:   Color, Urine YELLOW (*)    APPearance HAZY (*)    Hgb urine dipstick SMALL (*)    All other components within normal limits  LIPASE, BLOOD  CBC  POC URINE PREG, ED     RADIOLOGY I, Johnn Hai, personally viewed  and evaluated these images (plain radiographs) as part of my medical decision making, as well as reviewing the written report by the radiologist.   Official radiology report(s): CT Renal Stone Study  Result Date: 02/14/2020 CLINICAL DATA:  Hematuria, unknown cause EXAM: CT ABDOMEN AND PELVIS WITHOUT CONTRAST TECHNIQUE: Multidetector CT imaging of the abdomen and pelvis was performed following the standard protocol without IV contrast. COMPARISON:  08/11/2014 and prior. FINDINGS: Please note that lack of intravenous contrast limits evaluation of the viscera and vasculature. Lower chest: Bibasilar atelectasis. Hepatobiliary: No focal hepatic lesion. No biliary dilatation. Gallbladder is unremarkable. Pancreas: No focal lesions or pancreatic ductal dilatation. No surrounding inflammation. Spleen: Unremarkable. Adrenals/Urinary Tract: Adrenal glands are unremarkable. No focal renal lesion or calculi. No hydronephrosis. Bladder is unremarkable. Stomach/Bowel: Stomach is within normal limits. Appendix appears normal. No evidence of obstruction. Mild fecalization of distal ileal loops reflects slow transit. No bowel wall thickening or inflammatory changes.  No ascites. Vascular/Lymphatic: Vasculature is within normal limits for patient's age. No abdominopelvic adenopathy. Reproductive: 1.2 cm right adnexal cyst.  Otherwise unremarkable. Other: Fat containing supraumbilical hernia measuring 4.2 x 4.1 cm Musculoskeletal: No acute or significant osseous findings. IMPRESSION: No suspicious calculi or hydronephrosis. Normal appearance of the appendix. Slow transit involving distal ileal loops without obstruction. Fat containing supraumbilical hernia measuring 4.2 cm. Electronically Signed   By: Primitivo Gauze M.D.   On: 02/14/2020 13:44    ____________________________________________   PROCEDURES  Procedure(s) performed (including Critical  Care):  Procedures   ____________________________________________   INITIAL IMPRESSION / ASSESSMENT AND PLAN / ED COURSE  As part of my medical decision making, I reviewed the following data within the electronic MEDICAL RECORD NUMBER Notes from prior ED visits and [place] Controlled Substance Database  35 year old female presents to the ED with complaint of right-sided abdominal pain that started approximately 3 days ago.  Patient denies any fever, chills or diarrhea.  Patient states she had one episode of nausea and vomiting this morning.  She contacted her PCP who was concerned about appendicitis and sent her to the emergency department.  Patient reports that her menses was 4 days ago.  Patient was given Zofran ODT while in the ED.  White count was reassuring at 4.8, met C panel was unremarkable however urine did show RBCs and was concerning for a stone on the right to explain patient's pain.  Patient did not have any CVA tenderness on the right.  CT scan was negative for stones and appendix appeared to be normal.  There was 1.2 cm adnexal cyst noted on the right which may be the source of the patient's pain.  With the interpreter we discussed that it has been 2 years since she has had a Pap smear.  She is to call [employer] and schedule that which will also be the time that she would follow-up with the cyst.  She will continue to take over-the-counter medication such as Tylenol and ibuprofen.  She will return to the emergency department if any severe worsening of her symptoms.  ____________________________________________   FINAL CLINICAL IMPRESSION(S) / ED DIAGNOSES  Final diagnoses:  Adnexal cyst     ED Discharge Orders    None      *Please note:  Amber Harper was evaluated in Emergency Department on 02/14/2020 for the symptoms described in the history of present illness. She was evaluated in the context of the global COVID-19 pandemic, which necessitated  consideration that the patient might be at risk for infection with the SARS-CoV-2 virus that causes COVID-19. Institutional protocols and algorithms that pertain to the evaluation of patients at risk for COVID-19 are in a state of rapid change based on information released by regulatory bodies including the CDC and federal and state organizations. These policies and algorithms were followed during the patient's care in the ED.  Some ED evaluations and interventions may be delayed as a result of limited staffing during and the pandemic.*   Note:  This document was prepared using Dragon voice recognition software and may include unintentional dictation errors.    Johnn Hai, PA-C 02/14/20 1443    Vanessa Buchanan, MD 02/25/20 [phone]

## 2020-02-14 NOTE — ED Notes (Signed)
See triage note  Presents with pain to right side of abd   States pain started on Tuesday   No fever   States she did have vomiting this am afebrile on arrival

## 2020-07-20 ENCOUNTER — Other Ambulatory Visit: Payer: Self-pay

## 2020-07-20 MED ORDER — BROMOCRIPTINE MESYLATE 5 MG PO CAPS
ORAL_CAPSULE | ORAL | 3 refills | Status: AC
  Filled 2020-07-20: qty 30, 30d supply, fill #0
  Filled 2020-08-26 – 2020-09-09 (×2): qty 30, 30d supply, fill #1

## 2020-07-21 ENCOUNTER — Other Ambulatory Visit: Payer: Self-pay

## 2020-07-22 ENCOUNTER — Other Ambulatory Visit: Payer: Self-pay

## 2020-07-23 ENCOUNTER — Other Ambulatory Visit: Payer: Self-pay

## 2020-07-27 ENCOUNTER — Other Ambulatory Visit: Payer: Self-pay

## 2020-08-26 ENCOUNTER — Other Ambulatory Visit: Payer: Self-pay

## 2020-08-27 ENCOUNTER — Other Ambulatory Visit: Payer: Self-pay

## 2020-09-03 ENCOUNTER — Other Ambulatory Visit: Payer: Self-pay

## 2020-09-09 ENCOUNTER — Other Ambulatory Visit: Payer: Self-pay

## 2020-10-01 ENCOUNTER — Ambulatory Visit (HOSPITAL_COMMUNITY)
Admission: EM | Admit: 2020-10-01 | Discharge: 2020-10-01 | Disposition: A | Discharge status: Home / Self Care | Payer: Self-pay

## 2020-10-01 ENCOUNTER — Encounter (HOSPITAL_COMMUNITY): Payer: Self-pay

## 2020-10-01 ENCOUNTER — Other Ambulatory Visit: Payer: Self-pay

## 2020-10-01 DIAGNOSIS — H66001 Acute suppurative otitis media without spontaneous rupture of ear drum, right ear: Secondary | ICD-10-CM

## 2020-10-01 MED ORDER — AMOXICILLIN-POT CLAVULANATE 875-125 MG PO TABS: 1.0000 | ORAL_TABLET | Freq: Two times a day (BID) | ORAL | 0 refills | Status: AC

## 2020-10-01 NOTE — ED Provider Notes (Signed)
[place]    CSN: [id number] Arrival date & time: 10/01/20  1710      History   Chief Complaint Chief Complaint  Patient presents with   Otalgia   Cough    HPI Amber Harper is a 35 y.o. female.   Patient presents today with a 1 week history of URI symptoms that have improved but not yet resolved.  She is Spanish-speaking and video interpreter was utilized during this visit.  Her primary concern today is right-sided otalgia.  Reports that has been ongoing for the past day and has gradually been worsening.  Pain is rated 8 on a 0-10 pain scale, described as sharp, no aggravating relieving factors identified.  She has tried over-the-counter medications without improvement of symptoms.  Reports household sick contacts with similar symptoms.  Denies any recent antibiotic use.  She has had COVID-19 vaccinations.   Past Medical History:  Diagnosis Date   Arthritis    Constipation 07/04/2012   Overview:  Last Assessment & Plan:  Constipation symptoms as well as associated abdominal pain has resolved with Colace and MiraLAX. Now having regular bowel movements, approximately 1-2 a day which are soft and do not require straining. Continue medications as previously directed and return for follow-up as needed for any new or worsening symptoms.   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  Prolactin drawn as per request of endocrinologist.  Levels <400 are nml (up to date) although testing in pregnancy is not conclusive.  Above 400--visual field testing.    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 Authorizing Provider  amoxicillin-clavulanate (AUGMENTIN) 875-125 MG tablet Take 1 tablet by mouth every 12 (twelve) hours. 10/01/20  Yes Delcie Ruppert K, PA-C  bromocriptine (PARLODEL) 2.5 MG tablet Take 2.5 mg by mouth daily. 08/10/20   [provider]  bromocriptine (PARLODEL) 5 MG capsule Take 1 capsule (5 mg total) by mouth daily. 07/20/20     docusate sodium (COLACE) 100 MG capsule Take 100 mg by mouth 2 (two) times daily.    [provider]  ferrous sulfate 325 (65 FE) MG tablet Take 1 tablet (325 mg total) by mouth 2 (two) times daily with a meal. 03/13/17   Laury Deep, CNM  Prenatal Multivit-Min-Fe-FA (PRENATAL VITAMINS) 0.8 MG tablet Take 1 tablet by mouth daily. 08/16/16   Ardean Larsen,  Mervyn Skeeters, CNM    Family History Family History  Problem Relation Age of Onset   Diabetes Mother    Diabetes Father    Diabetes Maternal Grandmother    Diabetes Paternal Grandmother    Diabetes Maternal Uncle    Diabetes Paternal Uncle    Diabetes Maternal Grandfather    Diabetes Paternal Grandfather     Social History Social History   Tobacco Use   Smoking status: Never   Smokeless tobacco: Never  Substance Use Topics   Alcohol use: No   Drug use: Never     Allergies   Patient has no known allergies.   Review of Systems Review of Systems  Constitutional:  Positive for activity change. Negative for appetite change, fatigue and fever.  HENT:  Positive for congestion, ear pain and sore throat. Negative for sinus pressure and  sneezing.   Respiratory:  Negative for cough and shortness of breath.   Cardiovascular:  Negative for chest pain.  Gastrointestinal:  Negative for abdominal pain, diarrhea, nausea and vomiting.  Neurological:  Positive for headaches. Negative for dizziness and light-headedness.    Physical Exam Triage Vital Signs ED Triage Vitals  Enc Vitals Group     BP 10/01/20 1735 111/75     Pulse Rate 10/01/20 1735 71     Resp 10/01/20 1735 16     Temp 10/01/20 1735 98.4 F (36.9 C)     Temp Source 10/01/20 1735 Oral     SpO2 10/01/20 1735 97 %     Weight --      Height --      Head Circumference --      Peak Flow --      Pain Score 10/01/20 1732 8     Pain Loc --      Pain Edu? --      Excl. in [place]? --    No data found.  Updated Vital Signs BP 111/75 (BP Location: Left Arm)   Pulse 71   Temp 98.4 F (36.9 C) (Oral)   Resp 16   SpO2 97%   Visual Acuity Right Eye Distance:   Left Eye Distance:   Bilateral Distance:    Right Eye Near:   Left Eye Near:    Bilateral Near:     Physical Exam Vitals reviewed.  Constitutional:      General: She is awake. She is not in acute distress.    Appearance: Normal appearance. She is well-developed. She is not ill-appearing.     Comments: Very pleasant female appears stated age no acute distress sitting comfortably in exam room  HENT:     Head: Normocephalic and atraumatic.     Right Ear: Ear canal and external ear normal. Tympanic membrane is erythematous and bulging.     Left Ear: Tympanic membrane, ear canal and external ear normal. Tympanic membrane is not erythematous or bulging.     Nose:     Right Sinus: No maxillary sinus tenderness or frontal sinus tenderness.     Left Sinus: No maxillary sinus tenderness or frontal sinus tenderness.     Mouth/Throat:     Pharynx: Uvula midline. No oropharyngeal exudate or posterior oropharyngeal erythema.  Cardiovascular:     Rate and Rhythm: Normal rate and regular rhythm.     Heart sounds:  Normal heart sounds, S1 normal and S2 normal. No murmur heard. Pulmonary:     Effort: Pulmonary effort is normal.     Breath sounds: Normal breath sounds. No  wheezing, rhonchi or rales.     Comments: Clear to auscultation bilaterally Psychiatric:        Behavior: Behavior is cooperative.     UC Treatments / Results  Labs (all labs ordered are listed, but only abnormal results are displayed) Labs Reviewed - No data to display  EKG   Radiology No results found.  Procedures Procedures (including critical care time)  Medications Ordered in UC Medications - No data to display  Initial Impression / Assessment and Plan / UC Course  I have reviewed the triage vital signs and the nursing notes.  Pertinent labs & imaging results that were available during my care of the patient were reviewed by me and considered in my medical decision making (see chart for details).      Otitis media identified on physical exam.  Patient was started on Augmentin twice daily for 7 days.  No indication for COVID or flu testing given patient been symptomatic for over a week.  Recommend she use over-the-counter medications including Mucinex, Flonase, Tylenol, ibuprofen for symptom relief.  Discussed alarm symptoms that warrant emergent evaluation.  Strict return precautions given to which she expressed understanding.  Final Clinical Impressions(s) / UC Diagnoses   Final diagnoses:  Non-recurrent acute suppurative otitis media of right ear without spontaneous rupture of tympanic membrane     Discharge Instructions      Take Augmentin twice a day for a week.  Use over-the-counter medications including Tylenol, ibuprofen, Mucinex, Flonase for symptom relief.  If your symptoms or not improving please return for reevaluation.     ED Prescriptions     Medication Sig Dispense Auth. Provider   amoxicillin-clavulanate (AUGMENTIN) 875-125 MG tablet Take 1 tablet by mouth every 12 (twelve) hours. 14  tablet Jaxyn Mestas, Derry Skill, PA-C      PDMP not reviewed this encounter.   Terrilee Croak, PA-C 10/01/20 1904

## 2020-10-01 NOTE — Discharge Instructions (Addendum)
Take Augmentin twice a day for a week.  Use over-the-counter medications including Tylenol, ibuprofen, Mucinex, Flonase for symptom relief.  If your symptoms or not improving please return for reevaluation.

## 2020-10-01 NOTE — ED Triage Notes (Signed)
Pt reports right ear ain and cough x 1 day.

## 2023-06-23 ENCOUNTER — Ambulatory Visit: Payer: Self-pay | Admitting: Physician Assistant

## 2023-11-06 ENCOUNTER — Encounter: Payer: Self-pay | Admitting: Radiology
